# Patient Record
Sex: Female | Born: 1950 | Race: White | Hispanic: No | Marital: Single | Smoking: Former smoker
Health system: Southern US, Community
[De-identification: ages and names within clinical notes are randomized; demographics above are authoritative.]

## PROBLEM LIST (undated history)

## (undated) DIAGNOSIS — C801 Malignant (primary) neoplasm, unspecified: Secondary | ICD-10-CM

## (undated) DIAGNOSIS — J449 Chronic obstructive pulmonary disease, unspecified: Secondary | ICD-10-CM

---

## 2005-06-03 ENCOUNTER — Ambulatory Visit: Payer: Self-pay | Admitting: Family Medicine

## 2005-06-18 ENCOUNTER — Other Ambulatory Visit: Payer: Self-pay

## 2005-06-18 ENCOUNTER — Emergency Department: Payer: Self-pay | Admitting: Internal Medicine

## 2005-10-17 ENCOUNTER — Other Ambulatory Visit: Payer: Self-pay

## 2005-10-23 ENCOUNTER — Ambulatory Visit: Payer: Self-pay | Admitting: Urology

## 2011-10-22 ENCOUNTER — Inpatient Hospital Stay: Payer: Self-pay | Admitting: Urology

## 2011-10-22 LAB — CBC
HCT: 44.4 % (ref 35.0–47.0)
HGB: 14.5 g/dL (ref 12.0–16.0)
MCH: 30.1 pg (ref 26.0–34.0)
MCHC: 32.6 g/dL (ref 32.0–36.0)
MCV: 92 fL (ref 80–100)
RBC: 4.8 10*6/uL (ref 3.80–5.20)
WBC: 13.9 10*3/uL — ABNORMAL HIGH (ref 3.6–11.0)

## 2011-10-22 LAB — URINALYSIS, COMPLETE
Bilirubin,UR: NEGATIVE
Leukocyte Esterase: NEGATIVE
Ph: 9 (ref 4.5–8.0)
Protein: 30
RBC,UR: 38 /HPF (ref 0–5)
Squamous Epithelial: <1
WBC UR: 4 /HPF (ref 0–5)

## 2011-10-22 LAB — COMPREHENSIVE METABOLIC PANEL
Albumin: 3.9 g/dL (ref 3.4–5.0)
Alkaline Phosphatase: 113 U/L (ref 50–136)
Anion Gap: 10 (ref 7–16)
BUN: 12 mg/dL (ref 7–18)
Bilirubin,Total: 0.3 mg/dL (ref 0.2–1.0)
Calcium, Total: 9.1 mg/dL (ref 8.5–10.1)
Chloride: 107 mmol/L (ref 98–107)
Co2: 27 mmol/L (ref 21–32)
Creatinine: 0.8 mg/dL (ref 0.60–1.30)
EGFR (African American): >60
EGFR (Non-African Amer.): >60
Glucose: 140 mg/dL — ABNORMAL HIGH (ref 65–99)
Osmolality: 289 (ref 275–301)
Potassium: 4.3 mmol/L (ref 3.5–5.1)
SGOT(AST): 22 U/L (ref 15–37)
SGPT (ALT): 25 U/L
Sodium: 144 mmol/L (ref 136–145)
Total Protein: 8.1 g/dL (ref 6.4–8.2)

## 2011-10-22 LAB — PROTIME-INR
INR: 0.9
Prothrombin Time: 12.9 secs (ref 11.5–14.7)

## 2011-10-23 LAB — CBC WITH DIFFERENTIAL/PLATELET
Basophil #: 0 10*3/uL (ref 0.0–0.1)
Basophil %: 0.5 %
Eosinophil %: 0.1 %
HCT: 32.9 % — ABNORMAL LOW (ref 35.0–47.0)
Lymphocyte #: 3.1 10*3/uL (ref 1.0–3.6)
Lymphocyte %: 32.7 %
MCV: 92 fL (ref 80–100)
Monocyte %: 9.6 %
Neutrophil #: 5.3 10*3/uL (ref 1.4–6.5)
Platelet: 220 10*3/uL (ref 150–440)
RDW: 13.6 % (ref 11.5–14.5)
WBC: 9.4 10*3/uL (ref 3.6–11.0)

## 2011-10-23 LAB — BASIC METABOLIC PANEL
Anion Gap: 11 (ref 7–16)
BUN: 8 mg/dL (ref 7–18)
Chloride: 111 mmol/L — ABNORMAL HIGH (ref 98–107)
Co2: 23 mmol/L (ref 21–32)
Creatinine: 0.61 mg/dL (ref 0.60–1.30)
EGFR (African American): >60
EGFR (Non-African Amer.): >60
Potassium: 4 mmol/L (ref 3.5–5.1)
Sodium: 145 mmol/L (ref 136–145)

## 2011-10-24 LAB — URINE CULTURE

## 2011-10-28 LAB — CULTURE, BLOOD (SINGLE)

## 2011-10-29 ENCOUNTER — Ambulatory Visit: Payer: Self-pay | Admitting: Urology

## 2011-11-06 ENCOUNTER — Ambulatory Visit: Payer: Self-pay | Admitting: Urology

## 2011-11-12 ENCOUNTER — Ambulatory Visit: Payer: Self-pay | Admitting: Urology

## 2011-12-04 ENCOUNTER — Ambulatory Visit: Payer: Self-pay | Admitting: Urology

## 2012-05-02 ENCOUNTER — Emergency Department: Payer: Self-pay | Admitting: Emergency Medicine

## 2012-05-02 LAB — CBC
HCT: 36.6 % (ref 35.0–47.0)
HGB: 12.1 g/dL (ref 12.0–16.0)
MCH: 30.4 pg (ref 26.0–34.0)
MCHC: 33 g/dL (ref 32.0–36.0)
MCV: 92 fL (ref 80–100)
Platelet: 589 10*3/uL — ABNORMAL HIGH (ref 150–440)
RBC: 3.97 10*6/uL (ref 3.80–5.20)
RDW: 14 % (ref 11.5–14.5)
WBC: 11.3 10*3/uL — ABNORMAL HIGH (ref 3.6–11.0)

## 2012-05-02 LAB — COMPREHENSIVE METABOLIC PANEL
Alkaline Phosphatase: 92 U/L (ref 50–136)
Anion Gap: 11 (ref 7–16)
Calcium, Total: 9.1 mg/dL (ref 8.5–10.1)
Chloride: 106 mmol/L (ref 98–107)
Co2: 27 mmol/L (ref 21–32)
EGFR (African American): >60
Osmolality: 287 (ref 275–301)
Potassium: 3.3 mmol/L — ABNORMAL LOW (ref 3.5–5.1)
SGOT(AST): 14 U/L — ABNORMAL LOW (ref 15–37)
Sodium: 144 mmol/L (ref 136–145)

## 2012-05-02 LAB — TROPONIN I: Troponin-I: <0.02 ng/mL

## 2012-05-02 LAB — LIPASE, BLOOD: Lipase: 225 U/L (ref 73–393)

## 2012-05-03 LAB — URINALYSIS, COMPLETE
Bilirubin,UR: NEGATIVE
Nitrite: NEGATIVE
Ph: 6 (ref 4.5–8.0)
Protein: 30
RBC,UR: 31 /HPF (ref 0–5)

## 2013-09-30 IMAGING — CR DG CHEST 1V PORT
1 series · 1 of 1 positions shown · non-contrast
Comparison: none

REASON FOR EXAM: hypotension
COMMENTS:

PROCEDURE:     DXR - DXR PORTABLE CHEST SINGLE VIEW  - October 22, 2011 [DATE]
RESULT:     Comparison: None

[ap]
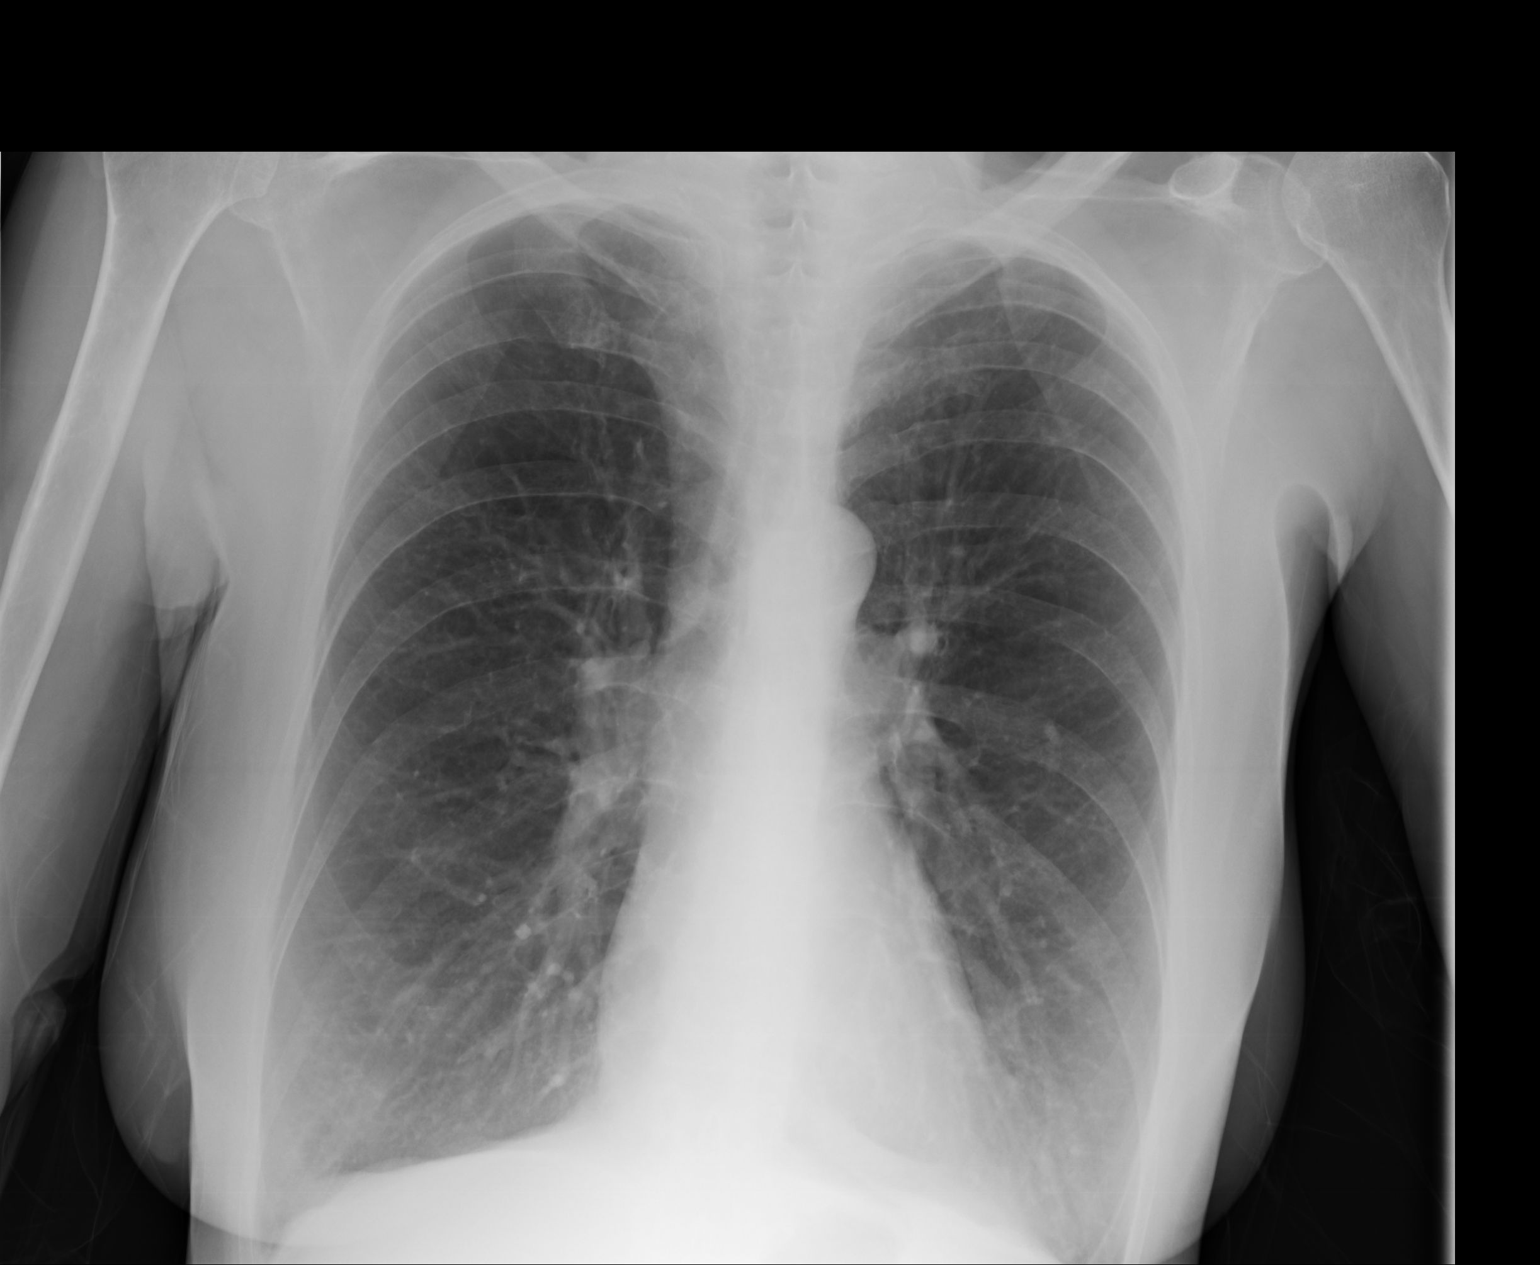

[1 of 1 positions shown; findings below may reference images not displayed]

FINDINGS: Single portable AP chest radiograph is provided. The left costophrenic angle
is excluded from the field-of-view. The lungs are hyperinflated likely
secondary to COPD. There is no focal parenchymal opacity, pleural effusion,
or pneumothorax. Normal cardiomediastinal silhouette. The osseous structures
are unremarkable.
IMPRESSION: No acute disease of the che[REDACTED]

## 2013-09-30 IMAGING — CR DG ABDOMEN 1V
1 series · 1 of 1 positions shown · non-contrast
Comparison: none

REASON FOR EXAM: visualize stone for possible ESL
COMMENTS:

PROCEDURE:     DXR - DXR KIDNEY URETER BLADDER  - October 22, 2011  [DATE]
RESULT:     Comparisons:  None

[x abdomen supine]
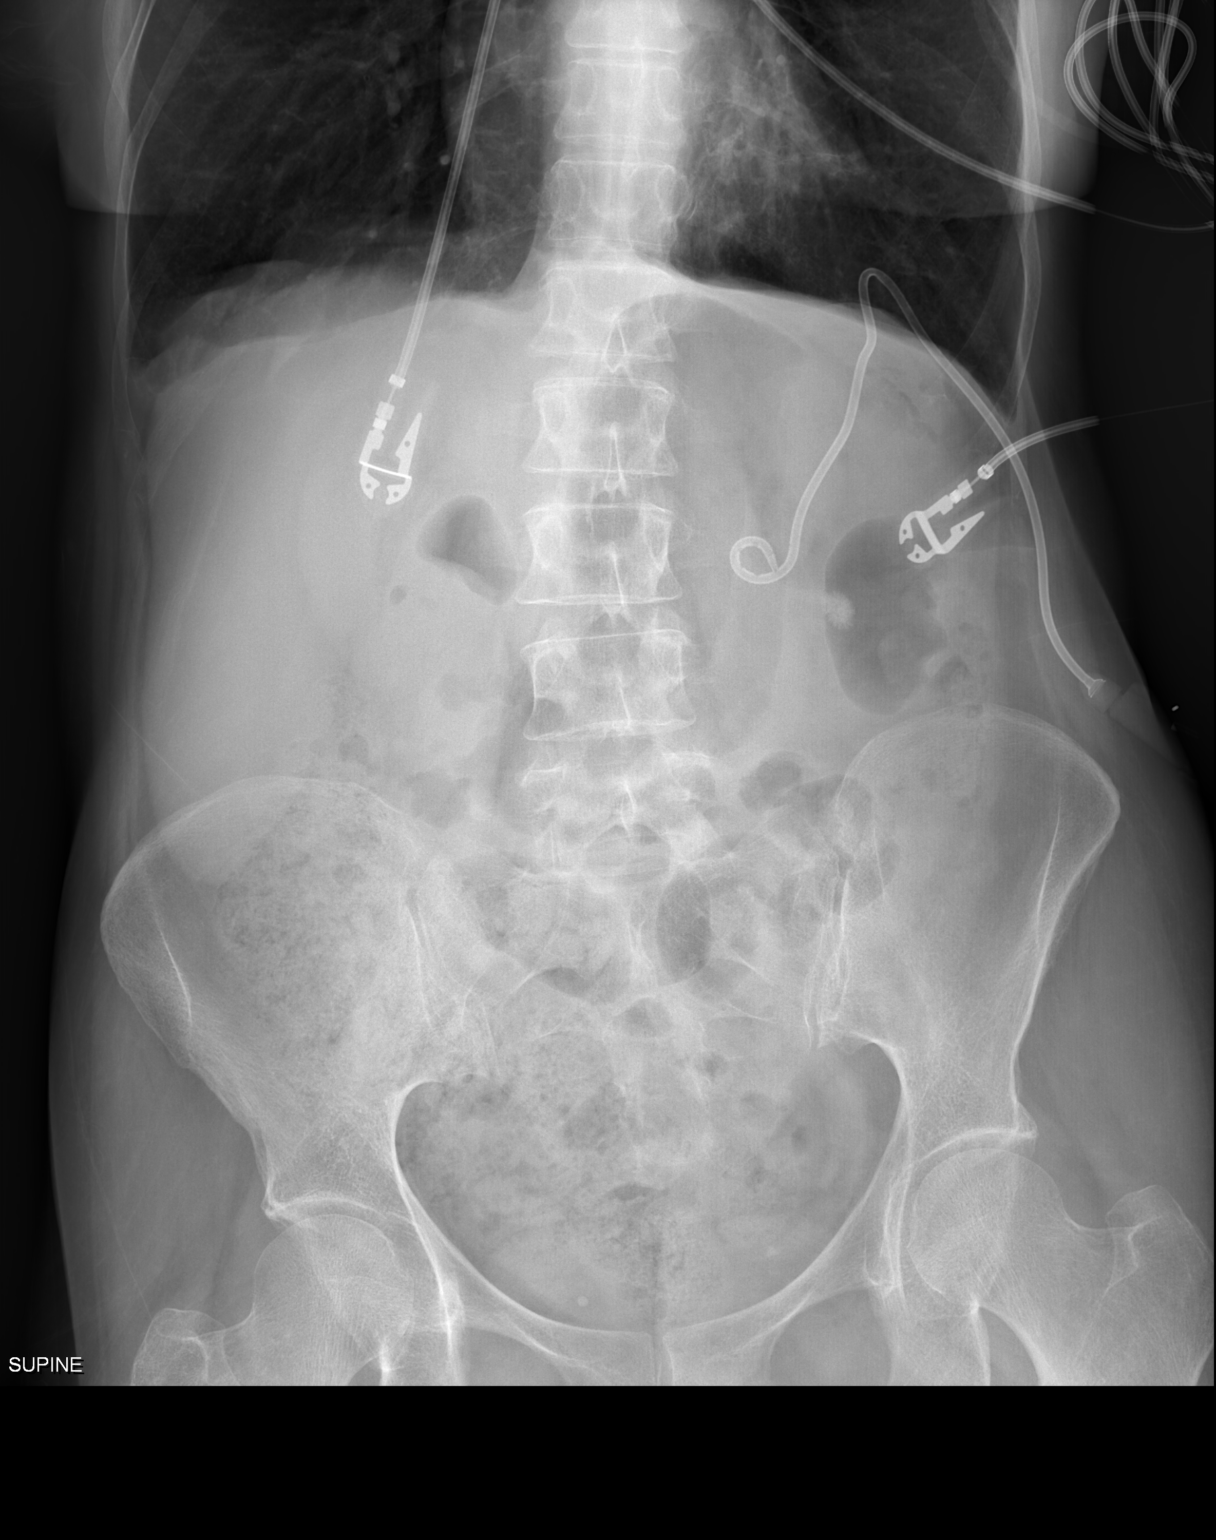

[1 of 1 positions shown; findings below may reference images not displayed]

FINDINGS: Supine radiograph of the abdomen is provided.

The visualized lungs are hyperinflated likely secondary to COPD.

There is a nonspecific bowel gas pattern. There is no bowel dilatation to
suggest obstruction. There is a left percutaneous nephrostomy tube present.
There is a spiculated calcification projecting over the left kidney. There
is no pathologic calcification along the expected course of the ureters.
There is no evidence of pneumoperitoneum, portal venous gas, or pneumatosis.

The osseous structures are unremarkable.
IMPRESSION: Left nephrolithiasis.

## 2013-10-21 IMAGING — XA IR NEPHROSTOMY
9 series · 9 of 9 positions shown · non-contrast
Comparison: none

REASON FOR EXAM: LT Nephrostogram Tube Removal   hydronephrosis
COMMENTS:

[Series 1: single · 1 of 1 slices shown (1 of 9)]
[im 1/1]
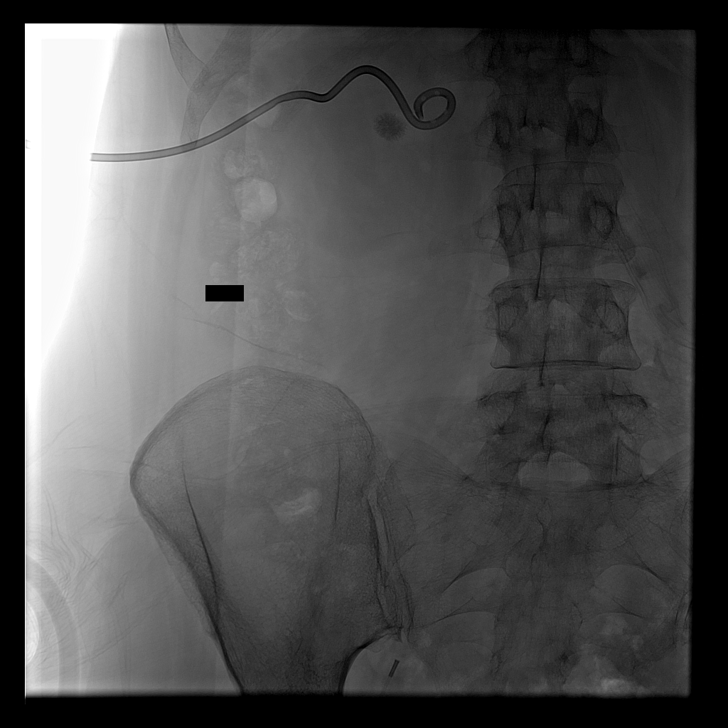

[Series 2: single · 1 of 1 slices shown (2 of 9)]
[im 1/1]
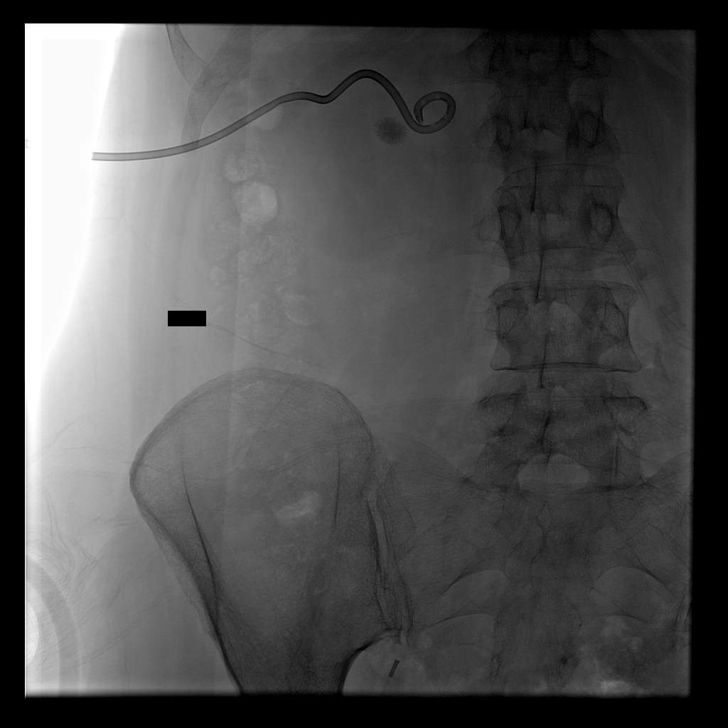

[Series 3: single · 1 of 1 slices shown (3 of 9)]
[im 1/1]
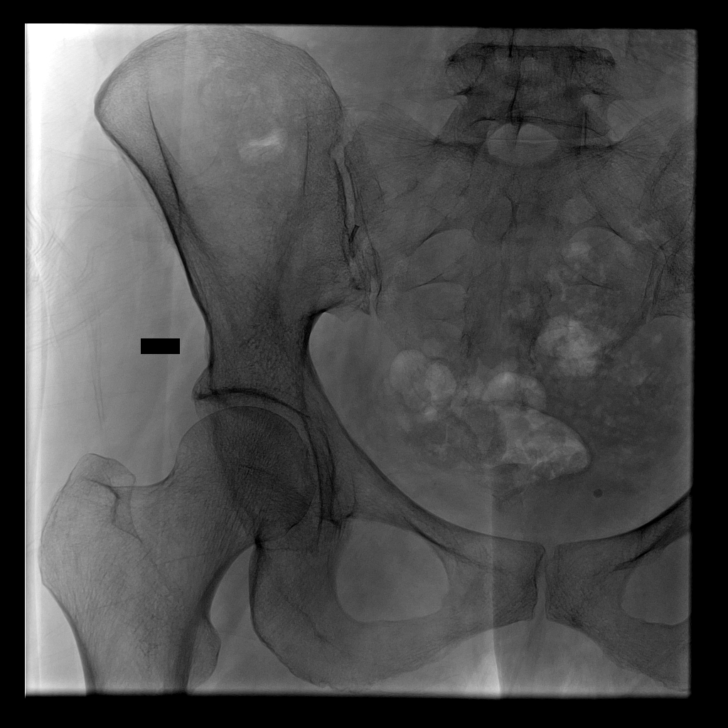

[Series 4: single · 1 of 1 slices shown (4 of 9)]
[im 1/1]
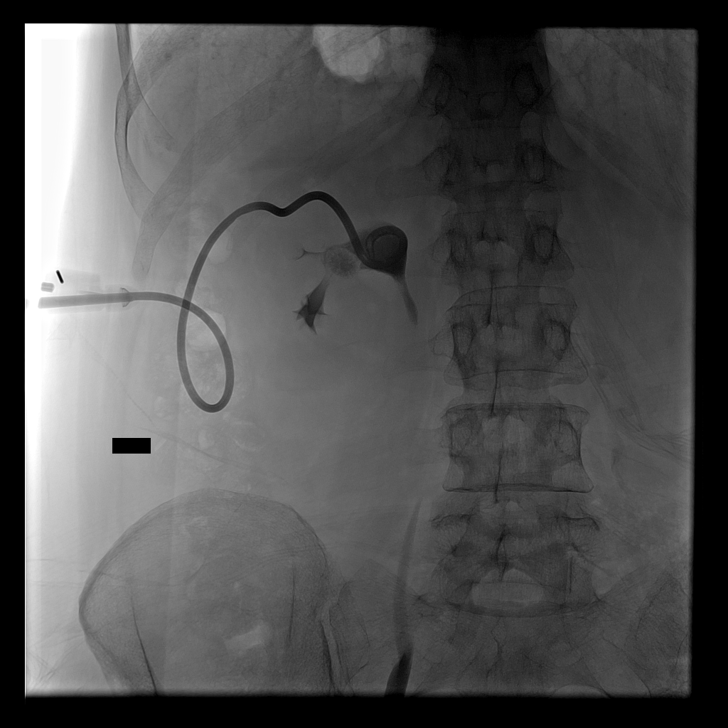

[Series 5: single · 1 of 1 slices shown (5 of 9)]
[im 1/1]
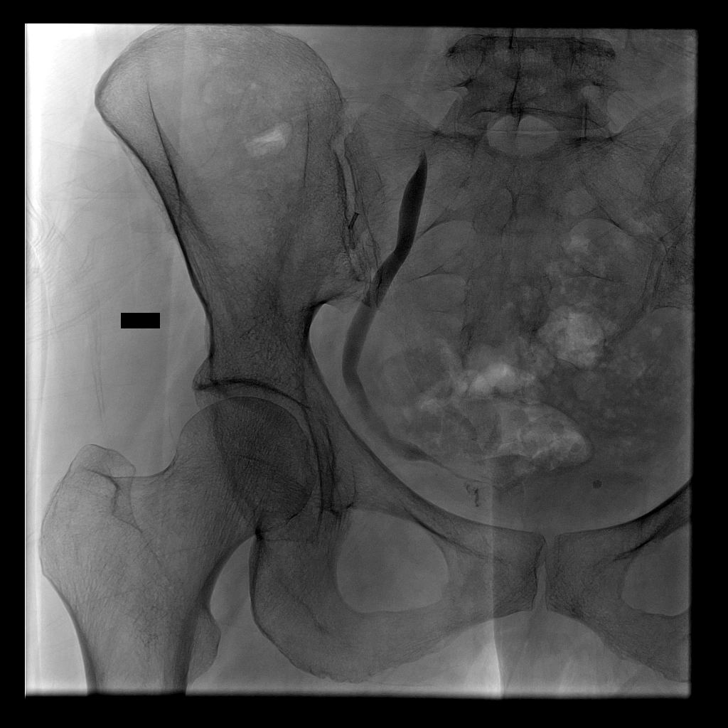

[Series 6: single · 1 of 1 slices shown (6 of 9)]
[im 1/1]
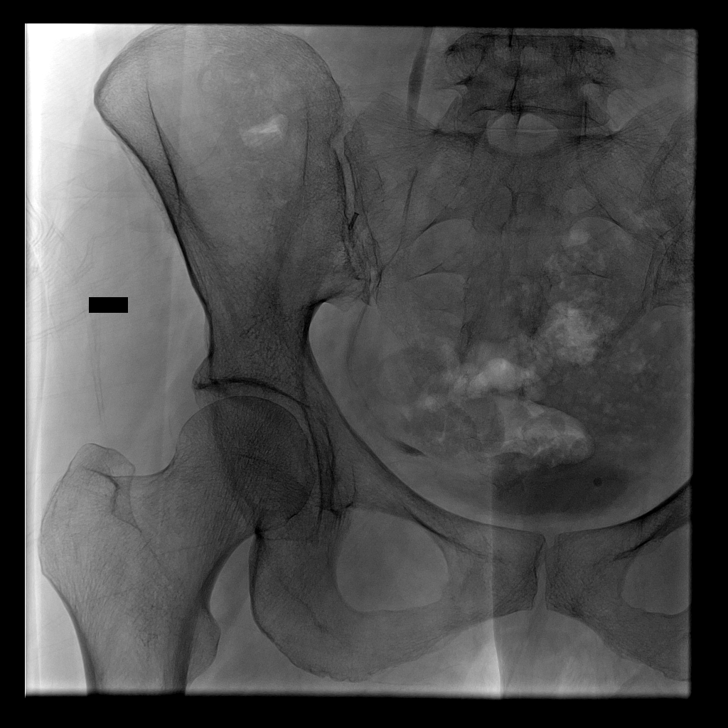

[Series 7: single · 1 of 1 slices shown (7 of 9)]
[im 1/1]
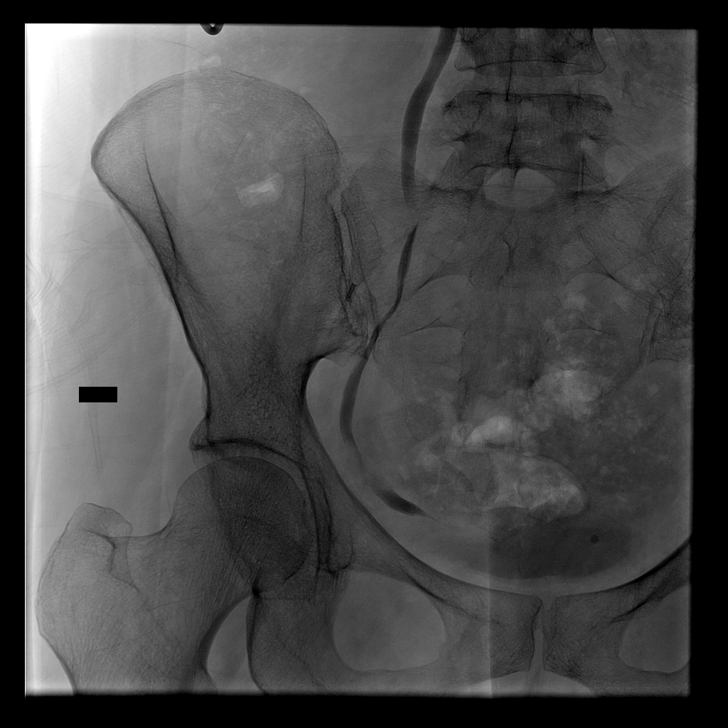

[Series 10: single · 1 of 1 slices shown (8 of 9)]
[im 1/1]
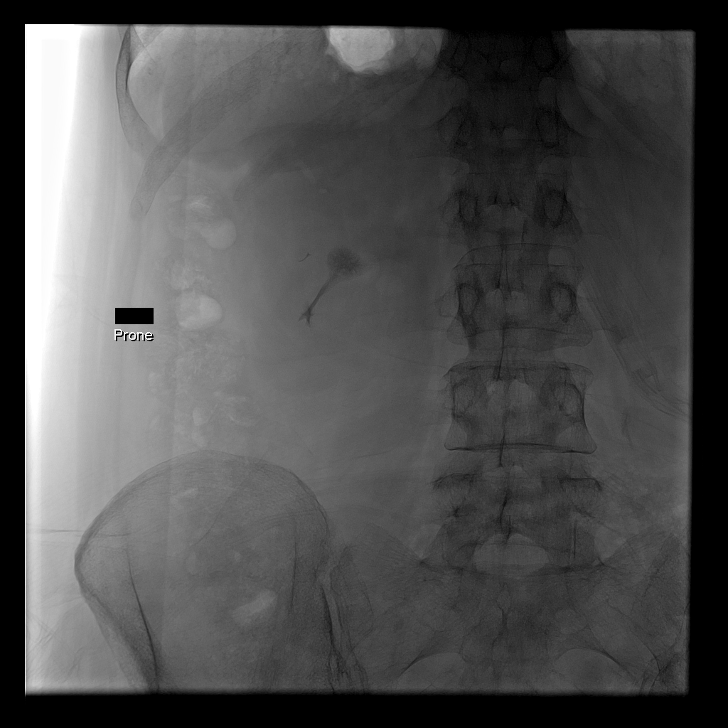

[Series 12: single · 1 of 1 slices shown (9 of 9)]
[im 1/1]
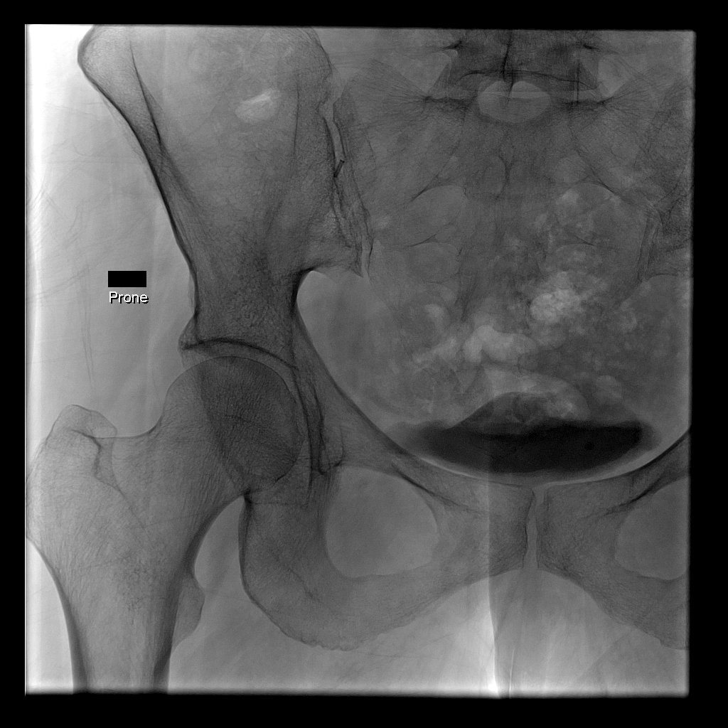

[9 of 9 positions shown; findings below may reference images not displayed]

PROCEDURE:     VAS - NEPHROSTOGRAM  - November 12, 2011  [DATE]

RESULT:

A left-sided nephrostogram was performed status post intravenous
administration of 50% diluted solution of sterile radiopaque contrast. The
patient's nephrostomy tube was injected. Contrast is appreciated filling a
decompressed left urinary collecting system with drainage appreciated within
the ureter and into the urinary bladder. Appropriate confirmatory imaging
was obtained. The nephrostomy tube was cut and then removed over an Amplatz
guidewire utilizing fluoroscopic guidance. The patient tolerated the
procedure without complications.
IMPRESSION: Left-sided nephrostomy tube removal as described above.

## 2014-11-26 NOTE — H&P (Signed)
PATIENT NAME:  Stacy, Zhang MR#:  [id number] DATE OF BIRTH:  02-09-1951  DATE OF ADMISSION:  10/22/2011  PRIMARY CARE PHYSICIAN: Salome Holmes, MD  CHIEF COMPLAINT: Left flank pain today with nausea and vomiting.  HISTORY OF PRESENT ILLNESS: Stacy Zhang is a pleasant 64 year old Caucasian female with past medical history of chronic obstructive pulmonary disease/emphysema and history of kidney stones who comes to the Emergency Room accompanied by her husband with complaints of left flank pain which woke her up in the morning around 3:30 a.m. and started having some vomiting along with nausea. The patient has been having hot and cold sweats and came to the Emergency Room via EMS. The patient was initially hypotensive and tachycardic on arrival and her blood pressure improved after 2 liters of IV fluids. She was found to have left distal ureterolithiasis along with left medullary kidney stone with obstructive uropathy. She received a dose of IV Rocephin.   The ER physician spoke with Dr. Maryan Puls and Dr. Yves Dill recommended to get left nephrostomy tube placement, IV antibiotics, and IV fluids. Internal Medicine was contacted for the patient's admission given her hypotension, possible sepsis/systemic inflammatory response syndrome due to infected urolithiasis and hydronephrosis. The patient is being admitted for further evaluation and management.   PAST MEDICAL HISTORY: Chronic obstructive pulmonary disease.   MEDICATIONS:  1. Q-Var 80 mcg/inhalation twice a day. 2. Combivent inhaler one puff four times daily.   PAST MEDICAL HISTORY:  1. Emphysema/chronic obstructive pulmonary disease.  2. History of nephrolithiasis in the past requiring stent placement and lithotripsy, performed in 1999.  ALLERGIES: No known drug allergies.   SOCIAL HISTORY: Ex-smoker. No alcohol use. Lives with husband at home.   FAMILY HISTORY: Positive for heart disease.   REVIEW OF SYSTEMS: CONSTITUTIONAL: Positive for  fever, fatigue, and weakness. EYES: No blurred or double vision. ENT: No tinnitus, ear pain, or hearing loss. RESPIRATORY: No cough, wheeze, or hemoptysis. CARDIOVASCULAR: No chest pain, orthopnea, or edema. GASTROINTESTINAL: Positive for nausea, vomiting, and abdominal pain. GU: Positive for dysuria. ENDOCRINE: No nocturia, polyuria, or thyroid problems. HEMATOLOGY: No anemia or easy bruising. SKIN: No acne or rash. MUSCULOSKELETAL: Positive for arthritis. NEUROLOGIC: No cerebrovascular accident or transient ischemic attack. PSYCH: No anxiety or depression. All other systems reviewed and negative.   PHYSICAL EXAMINATION:   GENERAL: The patient is awake, alert, and oriented x3, not in acute distress.   VITAL SIGNS: Temperature 99.4, pulse 121, blood pressure 113/61, and saturations are 99% on room air.   HEENT: Atraumatic, normocephalic. Pupils are equal, round, and reactive to light and accommodation. Extraocular movements intact. Oral mucosa is dry.   NECK: Supple. No JVD. No carotid bruit.   LUNGS: Clear to auscultation bilaterally. No rales, rhonchi, respiratory distress, or labored breathing.   HEART: Both heart sounds are normal. Rhythm is normal. Rate is tachycardic. No murmur heard. PMI is not lateralized. Chest is nontender.   EXTREMITIES: Good pedal pulses. Good femoral pulses. No lower extremity edema.   ABDOMEN: Soft. There is tenderness present in the left flank. No organomegaly. Bowel sounds are hypoactive.   NEUROLOGIC: Grossly intact cranial nerves II through XII. No motor or sensory deficits.   PSYCH: The patient is awake, alert, and oriented x3.   Chest x-ray: No acute disease of the chest.   Urinalysis is positive for urinary tract infection.  CT of the abdomen shows distal left ureteral calculus as well as left medullary calculus with moderate obstructive uropathy. There is stranding and findings  which may represent edema within the kidney.   White count 13.9,  hemoglobin and hematocrit 14.5 and 44.4. Lipase is normal. Comprehensive metabolic panel within normal limits, except glucose of 140. PT and INR 12.9 and 1.9.   ASSESSMENT: 64 year old Stacy Zhang with: 1. SIRS due to left-sided nephrolithiasis, ureterolithiasis, and urinary tract infection.  2. Left hydronephrosis due to left-sided ureterolithiasis and nephrolithiasis.  3. Hypertension now resolved with IV fluids.  4. Chronic obstructive pulmonary disease/emphysema, stable.  5. Leukocytosis.  6. History of kidney stones in the past.   PLAN:  1. Admit the patient to off unit telemetry.  2. FULL CODE.  3. Continue IV fluids.  4. Follow blood cultures, urine cultures.  5. Continue IV Rocephin.  6. Urology consultation by Dr. Maryan Puls. The case was discussed with Dr. Yves Dill who will see the patient in consultation. Dr. Eliberto Ivory recommends nephrostomy tube placement, which is going to be done by interventional radiology today.  7. Heparin for deep vein thrombosis prophylaxis.  8. PRN morphine for pain.  9. We will continue albuterol inhalers.         10. Further work-up according to the patient's clinical course. The hospital admission plan was discussed with the patient and her husband who were agreeable to it.   TIME SPENT: 50 minutes. ____________________________ Hart Rochester Posey Pronto, MD sap:slb D: 10/22/2011 13:21:14 ET T: 10/22/2011 14:36:33 ET JOB#: [id number]  cc: Keshia Weare A. Posey Pronto, MD, <Dictator> Salome Holmes, MD Ilda Basset MD ELECTRONICALLY SIGNED 11/10/2011 6:32

## 2014-11-26 NOTE — Consult Note (Signed)
PATIENT NAME:  Stacy Zhang, Stacy Zhang MR#:  [id number] DATE OF BIRTH:  [birth year]  DATE OF CONSULTATION:  10/22/2011  REFERRING PHYSICIAN:  Fritzi Mandes, MD CONSULTING PHYSICIAN:  Otelia Limes. Yves Dill, MD  REASON FOR CONSULTATION: Kidney stone.   HISTORY OF PRESENT ILLNESS: Stacy Zhang is a 64 year old Caucasian female with a one-month history of intermittent left flank pain. The pain became severe this morning and was associated with nausea and vomiting prompting Emergency Room visit. She had a CT scan in the Emergency Room which indicated the presence of a distal left ureteral stone with hydronephrosis and an edematous left kidney with stranding of the perinephric fat. The patient was hypotensive in the Emergency Room and felt to be toxic versus dehydrated. She was fluid resuscitated and given antibiotics. I ordered a nephrostomy tube placed which she currently has in place. At the present time, she is feeling somewhat better, although still having some flank pain.   ALLERGIES: No drug allergies.   CURRENT MEDICATIONS: Combivent and Qvar.   PAST SURGICAL HISTORY: Lithotripsy in 1999 and 2007.   SOCIAL HISTORY: She quit smoking 10 years ago with a 50 pack-year history. She denied alcohol or recreational drug use.   FAMILY HISTORY: Noncontributory.   PAST AND CURRENT MEDICAL CONDITIONS: The patient has a history of chronic obstructive pulmonary disease. She denied chest pain, heart disease, diabetes, stroke, or hypertension.   PHYSICAL EXAMINATION:  GENERAL: She is a chronically ill-appearing white female in no distress.   HEENT: Sclerae were clear. Pupils are equal, round, and reactive to light. Extraocular movements intact.   NECK: Supple. No palpable cervical adenopathy.   LUNGS: Decreased breath sounds bilaterally but clear.   CARDIOVASCULAR: Regular rhythm and rate without audible murmurs or gallops.   ABDOMEN: Soft, nontender abdomen. Nephrostomy tube was in place.   GU/RECTAL: Deferred.    MUSCULOSKELETAL/NEUROLOGIC: Nonfocal.  IMPRESSION: 1. Left pyonephrosis. 2. 4.7 mm distal left ureteral stone with obstruction.   PLAN:  1. Continue nephrostomy tube drainage.  2. Convert the patient to oral analgesics and advance diet.  3. Continue antibiotics pending culture results.  4. Ultimately, the patient will need lithotripsy after her urinary tract infection has resolved.   ____________________________ Otelia Limes. Yves Dill, MD mrw:ap D: 10/22/2011 17:55:38 ET T: 10/23/2011 07:28:47 ET JOB#: [id number]  cc: Otelia Limes. Yves Dill, MD, <Dictator>  Royston Cowper MD ELECTRONICALLY SIGNED 10/23/2011 10:28

## 2016-05-15 ENCOUNTER — Encounter: Payer: Self-pay | Admitting: Emergency Medicine

## 2016-05-15 ENCOUNTER — Emergency Department
Admission: EM | Admit: 2016-05-15 | Discharge: 2016-05-15 | Disposition: A | Discharge status: Home / Self Care | Payer: Medicare Other | Attending: Emergency Medicine | Admitting: Emergency Medicine

## 2016-05-15 DIAGNOSIS — Z87891 Personal history of nicotine dependence: Secondary | ICD-10-CM | POA: Insufficient documentation

## 2016-05-15 DIAGNOSIS — J449 Chronic obstructive pulmonary disease, unspecified: Secondary | ICD-10-CM | POA: Diagnosis not present

## 2016-05-15 DIAGNOSIS — B029 Zoster without complications: Secondary | ICD-10-CM | POA: Diagnosis not present

## 2016-05-15 DIAGNOSIS — R21 Rash and other nonspecific skin eruption: Secondary | ICD-10-CM | POA: Diagnosis present

## 2016-05-15 HISTORY — DX: Chronic obstructive pulmonary disease, unspecified: J44.9

## 2016-05-15 MED ORDER — SULFAMETHOXAZOLE-TRIMETHOPRIM 800-160 MG PO TABS: 1.0000 | ORAL_TABLET | Freq: Two times a day (BID) | ORAL | 0 refills | Status: DC

## 2016-05-15 MED ORDER — LIDOCAINE 5 % EX PTCH: 1.0000 | MEDICATED_PATCH | Freq: Two times a day (BID) | CUTANEOUS | 0 refills | Status: AC

## 2016-05-15 MED ORDER — VALACYCLOVIR HCL 1 G PO TABS: 1000.0000 mg | ORAL_TABLET | Freq: Three times a day (TID) | ORAL | 0 refills | Status: AC

## 2016-05-15 MED ORDER — OXYCODONE-ACETAMINOPHEN 5-325 MG PO TABS
1.0000 | ORAL_TABLET | Freq: Once | ORAL | Status: AC
  Administered 2016-05-15: 1 via ORAL
  Filled 2016-05-15: qty 1

## 2016-05-15 MED ORDER — OXYCODONE-ACETAMINOPHEN 5-325 MG PO TABS: 1.0000 | ORAL_TABLET | Freq: Four times a day (QID) | ORAL | 0 refills | Status: AC | PRN

## 2016-05-15 NOTE — ED Triage Notes (Signed)
C/O left mid back  Radiating to right mid back and side.  Pain accompanied by rash.

## 2016-05-15 NOTE — ED Provider Notes (Signed)
[facility] Emergency Department Provider Note  ____________________________________________  Time seen: Approximately 4:34 PM  I have reviewed the triage vital signs and the nursing notes.   HISTORY  Chief Complaint Rash    HPI Stacy Zhang is a 65 y.o. female , NAD, presents to the emergency department accompanied by her husband who assists with history. States she had a pain in her left mid back that began Saturday. Initially thought is was related to her COPD but she had no cough, chest congestion, chest pain, wheezing nor shortness of breath. Noted a rash show up with blisters the next day. The rash has spread and now wraps around the left-sided body onto the abdomen. Notes the pain about the rash and her left side is severe and tingles. Has not noted any oozing or weeping. No redness or swelling.No injuries or traumas to the torso. Denies fevers or chills. No pain about the eyes. No exposures to anyone with similar symptoms.   Past Medical History:  Diagnosis Date  . COPD (chronic obstructive pulmonary disease) (HCC)     There are no active problems to display for this patient.   History reviewed. No pertinent surgical history.  Prior to Admission medications   Medication Sig Start Date End Date Taking? Authorizing Provider  lidocaine (LIDODERM) 5 % Place 1 patch onto the skin every 12 (twelve) hours. Remove & Discard patch within 12 hours or as directed by MD 05/15/16 05/15/17  Jami L Hagler, PA-C  oxyCODONE-acetaminophen (ROXICET) 5-325 MG tablet Take 1 tablet by mouth every 6 (six) hours as needed. 05/15/16 05/15/17  Jami L Hagler, PA-C  sulfamethoxazole-trimethoprim (BACTRIM DS,SEPTRA DS) 800-160 MG tablet Take 1 tablet by mouth 2 (two) times daily. 05/15/16   Jami L Hagler, PA-C  valACYclovir (VALTREX) 1000 MG tablet Take 1 tablet (1,000 mg total) by mouth 3 (three) times daily. 05/15/16   Jami L Hagler, PA-C    Allergies Review of patient's  allergies indicates no known allergies.  No family history on file.  Social History Social History  Substance Use Topics  . Smoking status: Former Research scientist (life sciences)  . Smokeless tobacco: Never Used  . Alcohol use No     Review of Systems  Constitutional: No fever/chills Eyes: No eye pain Cardiovascular: No chest pain. Respiratory: No cough. No shortness of breath. No wheezing.  Gastrointestinal: No abdominal pain.  No nausea, vomiting.   Musculoskeletal: Negative for general myalgias.  Skin: Positive for rash. No oozing, weeping, redness, swelling.  Neurological: Positive tingling about rash. Negative for  focal weakness or numbness. 10-point ROS otherwise negative.  ____________________________________________   PHYSICAL EXAM:  VITAL SIGNS: ED Triage Vitals  Enc Vitals Group     BP 05/15/16 1612 132/88     Pulse Rate 05/15/16 1612 (!) 106     Resp 05/15/16 1612 16     Temp 05/15/16 1612 98.1 F (36.7 C)     Temp src --      SpO2 05/15/16 1612 94 %     Weight 05/15/16 1615 100 lb (45.4 kg)     Height 05/15/16 1615 5\' 2"  (1.575 m)     Head Circumference --      Peak Flow --      Pain Score 05/15/16 1615 10     Pain Loc --      Pain Edu? --      Excl. in [place]? --      Constitutional: Alert and oriented. Well appearing and in no  acute distress. Eyes: Conjunctivae are normal without icterus or injection  Head: Atraumatic.  Neck: Supple with FROM Hematological/Lymphatic/Immunilogical: No cervical lymphadenopathy. Cardiovascular: Normal rate, regular rhythm. Normal S1 and S2.  Good peripheral circulation. Respiratory: Normal respiratory effort without tachypnea or retractions. Lungs CTAB with breath sounds noted in all lung fields. Musculoskeletal: FROM of bilateral upper and lower extremities without pain or difficulty Neurologic:  Normal speech and language. No gross focal neurologic deficits are appreciated.  Skin:  Vesicular rash noted in a dermatomal pattern about the  torso from the left mid back wrapping around to the left mid and lower abdomen that does not cross the central line. No active oozing or weeping. Area is tender to touch. Rash's plans approximately 7 cm with and 15 cm in length. Skin is warm, dry and intact.  Psychiatric: Mood and affect are normal. Speech and behavior are normal. Patient exhibits appropriate insight and judgement.   ____________________________________________   LABS  None ____________________________________________  EKG  None ____________________________________________  RADIOLOGY  None ____________________________________________    PROCEDURES  Procedure(s) performed: None   Procedures   Medications  oxyCODONE-acetaminophen (PERCOCET/ROXICET) 5-325 MG per tablet 1 tablet (1 tablet Oral Given 05/15/16 1648)     ____________________________________________   INITIAL IMPRESSION / ASSESSMENT AND PLAN / ED COURSE  Pertinent labs & imaging results that were available during my care of the patient were reviewed by me and considered in my medical decision making (see chart for details).  Clinical Course    Patient's diagnosis is consistent with Herpes zoster without complication. Patient will be discharged home with prescriptions for Lidoderm patches, Percocet, Bactrim DS and Valtrex to take as directed. Patient is to follow up with her primary care provider in 48 hours for recheck and potential refill of pain medications. Discussion had with the patient and her husband at the bedside that considering the rash onset was 4 days ago that the antivirals may or may not assist and full resolution of the rash in a timely manner. Discussed the possibility of postherpetic neuralgia and complications of such. Patient and her husband were highly advised to call her primary care provider today to ensure she has a follow-up within 48 hours. Patient is given ED precautions to return to the ED for any worsening or new  symptoms.   ____________________________________________  FINAL CLINICAL IMPRESSION(S) / ED DIAGNOSES  Final diagnoses:  Herpes zoster without complication      NEW MEDICATIONS STARTED DURING THIS VISIT:  New Prescriptions   LIDOCAINE (LIDODERM) 5 %    Place 1 patch onto the skin every 12 (twelve) hours. Remove & Discard patch within 12 hours or as directed by MD   OXYCODONE-ACETAMINOPHEN (ROXICET) 5-325 MG TABLET    Take 1 tablet by mouth every 6 (six) hours as needed.   SULFAMETHOXAZOLE-TRIMETHOPRIM (BACTRIM DS,SEPTRA DS) 800-160 MG TABLET    Take 1 tablet by mouth 2 (two) times daily.   VALACYCLOVIR (VALTREX) 1000 MG TABLET    Take 1 tablet (1,000 mg total) by mouth 3 (three) times daily.         Braxton Feathers, PA-C 05/15/16 [place] Quigley, MD 05/15/16 [phone]

## 2017-09-04 ENCOUNTER — Other Ambulatory Visit: Payer: Self-pay

## 2017-09-04 ENCOUNTER — Emergency Department: Payer: Medicare Other

## 2017-09-04 ENCOUNTER — Inpatient Hospital Stay
Admission: EM | Admit: 2017-09-04 | Discharge: 2017-09-15 | DRG: 871 | Disposition: A | Discharge status: Home / Self Care | Payer: Medicare Other | Attending: Family Medicine | Admitting: Family Medicine

## 2017-09-04 DIAGNOSIS — Z66 Do not resuscitate: Secondary | ICD-10-CM | POA: Diagnosis present

## 2017-09-04 DIAGNOSIS — E876 Hypokalemia: Secondary | ICD-10-CM | POA: Diagnosis not present

## 2017-09-04 DIAGNOSIS — Z79899 Other long term (current) drug therapy: Secondary | ICD-10-CM

## 2017-09-04 DIAGNOSIS — N1330 Unspecified hydronephrosis: Secondary | ICD-10-CM | POA: Diagnosis present

## 2017-09-04 DIAGNOSIS — R339 Retention of urine, unspecified: Secondary | ICD-10-CM

## 2017-09-04 DIAGNOSIS — Z681 Body mass index (BMI) 19 or less, adult: Secondary | ICD-10-CM

## 2017-09-04 DIAGNOSIS — C787 Secondary malignant neoplasm of liver and intrahepatic bile duct: Secondary | ICD-10-CM | POA: Diagnosis present

## 2017-09-04 DIAGNOSIS — E43 Unspecified severe protein-calorie malnutrition: Secondary | ICD-10-CM | POA: Diagnosis present

## 2017-09-04 DIAGNOSIS — Z515 Encounter for palliative care: Secondary | ICD-10-CM | POA: Diagnosis present

## 2017-09-04 DIAGNOSIS — J441 Chronic obstructive pulmonary disease with (acute) exacerbation: Secondary | ICD-10-CM | POA: Diagnosis present

## 2017-09-04 DIAGNOSIS — R000 Tachycardia, unspecified: Secondary | ICD-10-CM | POA: Diagnosis present

## 2017-09-04 DIAGNOSIS — J9621 Acute and chronic respiratory failure with hypoxia: Secondary | ICD-10-CM | POA: Diagnosis present

## 2017-09-04 DIAGNOSIS — N179 Acute kidney failure, unspecified: Secondary | ICD-10-CM | POA: Diagnosis present

## 2017-09-04 DIAGNOSIS — Z87891 Personal history of nicotine dependence: Secondary | ICD-10-CM

## 2017-09-04 DIAGNOSIS — J9601 Acute respiratory failure with hypoxia: Secondary | ICD-10-CM

## 2017-09-04 DIAGNOSIS — R310 Gross hematuria: Secondary | ICD-10-CM

## 2017-09-04 DIAGNOSIS — M25551 Pain in right hip: Secondary | ICD-10-CM | POA: Diagnosis not present

## 2017-09-04 DIAGNOSIS — R6520 Severe sepsis without septic shock: Secondary | ICD-10-CM | POA: Diagnosis present

## 2017-09-04 DIAGNOSIS — F419 Anxiety disorder, unspecified: Secondary | ICD-10-CM | POA: Diagnosis present

## 2017-09-04 DIAGNOSIS — I214 Non-ST elevation (NSTEMI) myocardial infarction: Secondary | ICD-10-CM | POA: Diagnosis present

## 2017-09-04 DIAGNOSIS — J189 Pneumonia, unspecified organism: Secondary | ICD-10-CM | POA: Diagnosis present

## 2017-09-04 DIAGNOSIS — R64 Cachexia: Secondary | ICD-10-CM | POA: Diagnosis present

## 2017-09-04 DIAGNOSIS — A419 Sepsis, unspecified organism: Principal | ICD-10-CM | POA: Diagnosis present

## 2017-09-04 DIAGNOSIS — R54 Age-related physical debility: Secondary | ICD-10-CM | POA: Diagnosis present

## 2017-09-04 DIAGNOSIS — C7951 Secondary malignant neoplasm of bone: Secondary | ICD-10-CM | POA: Diagnosis present

## 2017-09-04 DIAGNOSIS — C539 Malignant neoplasm of cervix uteri, unspecified: Secondary | ICD-10-CM | POA: Diagnosis present

## 2017-09-04 LAB — COMPREHENSIVE METABOLIC PANEL
ALK PHOS: 106 U/L (ref 38–126)
ALT: 24 U/L (ref 14–54)
AST: 67 U/L — AB (ref 15–41)
Albumin: 3 g/dL — ABNORMAL LOW (ref 3.5–5.0)
Anion gap: 12 (ref 5–15)
BUN: 16 mg/dL (ref 6–20)
CALCIUM: 8.8 mg/dL — AB (ref 8.9–10.3)
CHLORIDE: 102 mmol/L (ref 101–111)
CO2: 23 mmol/L (ref 22–32)
Creatinine, Ser: 1.08 mg/dL — ABNORMAL HIGH (ref 0.44–1.00)
GFR calc Af Amer: >60 mL/min (ref >60)
GFR calc non Af Amer: 52 mL/min — ABNORMAL LOW (ref >60)
Glucose, Bld: 110 mg/dL — ABNORMAL HIGH (ref 65–99)
Potassium: 4 mmol/L (ref 3.5–5.1)
SODIUM: 137 mmol/L (ref 135–145)
Total Bilirubin: 0.7 mg/dL (ref 0.3–1.2)
Total Protein: 7.8 g/dL (ref 6.5–8.1)

## 2017-09-04 LAB — CBC WITH DIFFERENTIAL/PLATELET
BASOS ABS: 0.1 10*3/uL (ref 0–0.1)
Basophils Relative: 0 %
EOS PCT: 0 %
Eosinophils Absolute: 0 10*3/uL (ref 0–0.7)
HCT: 36.5 % (ref 35.0–47.0)
HEMOGLOBIN: 11.8 g/dL — AB (ref 12.0–16.0)
LYMPHS ABS: 1.4 10*3/uL (ref 1.0–3.6)
LYMPHS PCT: 4 %
MCH: 27.7 pg (ref 26.0–34.0)
MCHC: 32.2 g/dL (ref 32.0–36.0)
MCV: 85.9 fL (ref 80.0–100.0)
Monocytes Absolute: 1.9 10*3/uL — ABNORMAL HIGH (ref 0.2–0.9)
Monocytes Relative: 5 %
NEUTROS PCT: 91 %
Neutro Abs: 34.1 10*3/uL — ABNORMAL HIGH (ref 1.4–6.5)
PLATELETS: 432 10*3/uL (ref 150–440)
RBC: 4.25 MIL/uL (ref 3.80–5.20)
RDW: 14.5 % (ref 11.5–14.5)
WBC: 37.5 10*3/uL — AB (ref 3.6–11.0)

## 2017-09-04 LAB — TROPONIN I: Troponin I: 0.03 ng/mL (ref <0.03)

## 2017-09-04 LAB — INFLUENZA PANEL BY PCR (TYPE A & B)
INFLAPCR: NEGATIVE
Influenza B By PCR: NEGATIVE

## 2017-09-04 MED ORDER — IPRATROPIUM-ALBUTEROL 0.5-2.5 (3) MG/3ML IN SOLN
RESPIRATORY_TRACT | Status: AC
  Administered 2017-09-04: 3 mL via RESPIRATORY_TRACT
  Filled 2017-09-04: qty 6

## 2017-09-04 MED ORDER — IPRATROPIUM-ALBUTEROL 0.5-2.5 (3) MG/3ML IN SOLN
3.0000 mL | Freq: Once | RESPIRATORY_TRACT | Status: AC
  Administered 2017-09-04: 3 mL via RESPIRATORY_TRACT

## 2017-09-04 MED ORDER — DEXTROSE 5 % IV SOLN
500.0000 mg | Freq: Once | INTRAVENOUS | Status: AC
  Administered 2017-09-05: 500 mg via INTRAVENOUS
  Filled 2017-09-04: qty 500

## 2017-09-04 MED ORDER — CEFTRIAXONE SODIUM 1 G IJ SOLR
1.0000 g | Freq: Once | INTRAMUSCULAR | Status: AC
  Administered 2017-09-05: 1 g via INTRAVENOUS
  Filled 2017-09-04: qty 10

## 2017-09-04 MED ORDER — METHYLPREDNISOLONE SODIUM SUCC 125 MG IJ SOLR
125.0000 mg | Freq: Once | INTRAMUSCULAR | Status: AC
  Administered 2017-09-04: 125 mg via INTRAVENOUS

## 2017-09-04 MED ORDER — METHYLPREDNISOLONE SODIUM SUCC 125 MG IJ SOLR
INTRAMUSCULAR | Status: AC
  Administered 2017-09-04: 125 mg via INTRAVENOUS
  Filled 2017-09-04: qty 2

## 2017-09-04 MED ORDER — SODIUM CHLORIDE 0.9 % IV BOLUS (SEPSIS)
250.0000 mL | Freq: Once | INTRAVENOUS | Status: AC
  Administered 2017-09-05: 250 mL via INTRAVENOUS

## 2017-09-04 MED ORDER — SODIUM CHLORIDE 0.9 % IV BOLUS (SEPSIS)
1000.0000 mL | Freq: Once | INTRAVENOUS | Status: AC
  Administered 2017-09-05: 1000 mL via INTRAVENOUS

## 2017-09-04 NOTE — ED Triage Notes (Signed)
Pt arrived via EMS from home d/t difficulty breathing since this morning. Pt reports left rib cage. Pt has hx of chronic COPD. Pt is A&O x4 at this time.

## 2017-09-04 NOTE — ED Provider Notes (Signed)
[facility] Emergency Department Provider Note    First MD Initiated Contact with Patient 09/04/17 2227     (approximate)  I have reviewed the triage vital signs and the nursing notes.   HISTORY  Chief Complaint Shortness of Breath and Respiratory Distress    HPI Stacy Zhang is a 67 y.o. female a history of COPD not on home oxygen who presents with 1 day worsening shortness of breath cough.  Patient called EMS this morning was given nebulizer treatment and felt better so she refused transport to the ER.  Symptoms became worse again this afternoon.  Was unable to breathe.  Patient found to be hypoxic by EMS and brought into the ER after getting nebulizer treatment.  No recent antibiotic treatments.  Denies any chest pain.  No lower extremity swelling.  Patient is very frail and almost cachectic appearing.  States she does not wear home oxygen.  States that she is been using his her home nebulizer treatments about every 3 hours without improvement.  Past Medical History:  Diagnosis Date  . COPD (chronic obstructive pulmonary disease) ([place])    No family history on file. No past surgical history on file. There are no active problems to display for this patient.     Prior to Admission medications   Medication Sig Start Date End Date Taking? Authorizing Provider  COMBIVENT RESPIMAT 20-100 MCG/ACT AERS respimat Inhale 2 puffs into the lungs 2 (two) times daily. 08/10/17  Yes [provider]  fluticasone (FLONASE) 50 MCG/ACT nasal spray Place 1 spray into both nostrils daily. 08/10/17  Yes [provider]  sulfamethoxazole-trimethoprim (BACTRIM DS,SEPTRA DS) 800-160 MG tablet Take 1 tablet by mouth 2 (two) times daily. Patient not taking: Reported on 09/04/2017 05/15/16   Hagler, Jami L, PA-C  valACYclovir (VALTREX) 1000 MG tablet Take 1 tablet (1,000 mg total) by mouth 3 (three) times daily. Patient not taking: Reported on 09/04/2017 05/15/16    Hagler, Jami L, PA-C    Allergies Patient has no known allergies.    Social History Social History   Tobacco Use  . Smoking status: Former Research scientist (life sciences)  . Smokeless tobacco: Never Used  Substance Use Topics  . Alcohol use: No  . Drug use: Not on file    Review of Systems Patient denies headaches, rhinorrhea, blurry vision, numbness, shortness of breath, chest pain, edema, cough, abdominal pain, nausea, vomiting, diarrhea, dysuria, fevers, rashes or hallucinations unless otherwise stated above in HPI. ____________________________________________   PHYSICAL EXAM:  VITAL SIGNS: Vitals:   09/04/17 2232  BP: (!) 145/80  Pulse: (!) 127  Resp: (!) 22  Temp: 99.3 F (37.4 C)  SpO2: 95%    Constitutional: Alert and oriented.cachectic, Critically ill appearing in moderate respiratory distress Eyes: Conjunctivae are normal.  Head: Atraumatic. Nose: No congestion/rhinnorhea. Mouth/Throat: Mucous membranes are moist.   Neck: No stridor. Painless ROM.  Cardiovascular: tachycardic rate, regular rhythm. Grossly normal heart sounds.  Good peripheral circulation. Respiratory: tachypnea, hypoxic on room air, diminished breathsounds throughout, absent in bases posteriorly Gastrointestinal: Soft and nontender. No distention. No abdominal bruits. No CVA tenderness. Genitourinary:  Musculoskeletal: No lower extremity tenderness nor edema.  No joint effusions. Neurologic:  Normal speech and language. No gross focal neurologic deficits are appreciated. No facial droop Skin:  Skin is warm, dry and intact. No rash noted. Psychiatric: Mood and affect are normal. Speech and behavior are normal.  ____________________________________________   LABS (all labs ordered are listed, but only abnormal results are  displayed)  Results for orders placed or performed during the hospital encounter of 09/04/17 (from the past 24 hour(s))  Influenza panel by PCR (type A & B)     Status: None   Collection  Time: 09/04/17 10:22 PM  Result Value Ref Range   Influenza A By PCR NEGATIVE NEGATIVE   Influenza B By PCR NEGATIVE NEGATIVE  CBC with Differential/Platelet     Status: Abnormal   Collection Time: 09/04/17 10:22 PM  Result Value Ref Range   WBC 37.5 (H) 3.6 - 11.0 K/uL   RBC 4.25 3.80 - 5.20 MIL/uL   Hemoglobin 11.8 (L) 12.0 - 16.0 g/dL   HCT 36.5 35.0 - 47.0 %   MCV 85.9 80.0 - 100.0 fL   MCH 27.7 26.0 - 34.0 pg   MCHC 32.2 32.0 - 36.0 g/dL   RDW 14.5 11.5 - 14.5 %   Platelets 432 150 - 440 K/uL   Neutrophils Relative % 91 %   Neutro Abs 34.1 (H) 1.4 - 6.5 K/uL   Lymphocytes Relative 4 %   Lymphs Abs 1.4 1.0 - 3.6 K/uL   Monocytes Relative 5 %   Monocytes Absolute 1.9 (H) 0.2 - 0.9 K/uL   Eosinophils Relative 0 %   Eosinophils Absolute 0.0 0 - 0.7 K/uL   Basophils Relative 0 %   Basophils Absolute 0.1 0 - 0.1 K/uL  Comprehensive metabolic panel     Status: Abnormal   Collection Time: 09/04/17 10:22 PM  Result Value Ref Range   Sodium 137 135 - 145 mmol/L   Potassium 4.0 3.5 - 5.1 mmol/L   Chloride 102 101 - 111 mmol/L   CO2 23 22 - 32 mmol/L   Glucose, Bld 110 (H) 65 - 99 mg/dL   BUN 16 6 - 20 mg/dL   Creatinine, Ser 1.08 (H) 0.44 - 1.00 mg/dL   Calcium 8.8 (L) 8.9 - 10.3 mg/dL   Total Protein 7.8 6.5 - 8.1 g/dL   Albumin 3.0 (L) 3.5 - 5.0 g/dL   AST 67 (H) 15 - 41 U/L   ALT 24 14 - 54 U/L   Alkaline Phosphatase 106 38 - 126 U/L   Total Bilirubin 0.7 0.3 - 1.2 mg/dL   GFR calc non Af Amer 52 (L) >60 mL/min   GFR calc Af Amer >60 >60 mL/min   Anion gap 12 5 - 15  Troponin I     Status: Abnormal   Collection Time: 09/04/17 10:22 PM  Result Value Ref Range   Troponin I 0.03 (HH) <0.03 ng/mL   ____________________________________________  EKG My review and personal interpretation at Time: 22:35   Indication: resp distress  Rate: 130  Rhythm: sinus Axis: normal Other: poor r wave progression, non specific st  changes ____________________________________________  RADIOLOGY  I personally reviewed all radiographic images ordered to evaluate for the above acute complaints and reviewed radiology reports and findings.  These findings were personally discussed with the patient.  Please see medical record for radiology report.  ____________________________________________   PROCEDURES  Procedure(s) performed:  .Critical Care Performed by: Merlyn Lot, MD Authorized by: Merlyn Lot, MD   Critical care provider statement:    Critical care time (minutes):  30   Critical care time was exclusive of:  Separately billable procedures and treating other patients   Critical care was time spent personally by me on the following activities:  Development of treatment plan with patient or surrogate, discussions with consultants, evaluation of patient's response to treatment, examination  of patient, obtaining history from patient or surrogate, ordering and performing treatments and interventions, ordering and review of laboratory studies, ordering and review of radiographic studies, pulse oximetry, re-evaluation of patient's condition and review of old [place] performed: yes ____________________________________________   INITIAL IMPRESSION / ASSESSMENT AND PLAN / ED COURSE  Pertinent labs & imaging results that were available during my care of the patient were reviewed by me and considered in my medical decision making (see chart for details).  DDX: Asthma, copd, CHF, pna, ptx, malignancy, Pe, anemia   HEILY CARLUCCI is a 67 y.o. who presents to the ED with respiratory distress with hypoxia as described above.  Patient is a very frail-appearing.  Currently protecting her airway and her oxygenation improved with supplemental oxygen.  Will give nebulizer treatments.  Will give IV steroids and IV fluids.  Will be sent for the above differential.  Seems less consistent with heart  failure.  More likely COPD or underlying pneumonia.    ----------------------------------------- 11:51 PM on 09/04/2017 -----------------------------------------  Blood work disorder seems most consistent with multifocal pneumonia.  Will start broad-spectrum antibiotics.  We will continue with treatment for underlying emphysema COPD.  Seems less consistent with CHF.  Is on her presentation persistent shortness of breath and increased work of breath and hypoxia to 90% on 4 L nasal cannula will place on BiPAP ABG as well as CT angiogram to rule out PE.  This is most clinically consistent patient does not appears well does not seem to have much reserve.  Spoke with hospitalist who kindly agrees to admit patient.  ____________________________________________   FINAL CLINICAL IMPRESSION(S) / ED DIAGNOSES  Final diagnoses:  Acute respiratory failure with hypoxia ([place])  Multifocal pneumonia      NEW MEDICATIONS STARTED DURING THIS VISIT:  New Prescriptions   No medications on file     Note:  This document was prepared using Dragon voice recognition software and may include unintentional dictation errors.    Merlyn Lot, MD 09/04/17 [phone]

## 2017-09-05 ENCOUNTER — Other Ambulatory Visit: Payer: Self-pay

## 2017-09-05 ENCOUNTER — Emergency Department: Payer: Medicare Other

## 2017-09-05 ENCOUNTER — Encounter: Payer: Self-pay | Admitting: Radiology

## 2017-09-05 DIAGNOSIS — C539 Malignant neoplasm of cervix uteri, unspecified: Secondary | ICD-10-CM | POA: Diagnosis present

## 2017-09-05 DIAGNOSIS — R54 Age-related physical debility: Secondary | ICD-10-CM | POA: Diagnosis present

## 2017-09-05 DIAGNOSIS — R64 Cachexia: Secondary | ICD-10-CM | POA: Diagnosis present

## 2017-09-05 DIAGNOSIS — Z66 Do not resuscitate: Secondary | ICD-10-CM | POA: Diagnosis present

## 2017-09-05 DIAGNOSIS — F419 Anxiety disorder, unspecified: Secondary | ICD-10-CM | POA: Diagnosis present

## 2017-09-05 DIAGNOSIS — A419 Sepsis, unspecified organism: Secondary | ICD-10-CM | POA: Diagnosis present

## 2017-09-05 DIAGNOSIS — J9621 Acute and chronic respiratory failure with hypoxia: Secondary | ICD-10-CM | POA: Diagnosis present

## 2017-09-05 DIAGNOSIS — E876 Hypokalemia: Secondary | ICD-10-CM | POA: Diagnosis not present

## 2017-09-05 DIAGNOSIS — C787 Secondary malignant neoplasm of liver and intrahepatic bile duct: Secondary | ICD-10-CM | POA: Diagnosis present

## 2017-09-05 DIAGNOSIS — N1330 Unspecified hydronephrosis: Secondary | ICD-10-CM | POA: Diagnosis present

## 2017-09-05 DIAGNOSIS — M25551 Pain in right hip: Secondary | ICD-10-CM | POA: Diagnosis not present

## 2017-09-05 DIAGNOSIS — N179 Acute kidney failure, unspecified: Secondary | ICD-10-CM | POA: Diagnosis present

## 2017-09-05 DIAGNOSIS — E43 Unspecified severe protein-calorie malnutrition: Secondary | ICD-10-CM | POA: Diagnosis present

## 2017-09-05 DIAGNOSIS — I214 Non-ST elevation (NSTEMI) myocardial infarction: Secondary | ICD-10-CM | POA: Diagnosis present

## 2017-09-05 DIAGNOSIS — Z681 Body mass index (BMI) 19 or less, adult: Secondary | ICD-10-CM | POA: Diagnosis not present

## 2017-09-05 DIAGNOSIS — J441 Chronic obstructive pulmonary disease with (acute) exacerbation: Secondary | ICD-10-CM | POA: Diagnosis present

## 2017-09-05 DIAGNOSIS — J9601 Acute respiratory failure with hypoxia: Secondary | ICD-10-CM | POA: Diagnosis present

## 2017-09-05 DIAGNOSIS — J189 Pneumonia, unspecified organism: Secondary | ICD-10-CM | POA: Diagnosis present

## 2017-09-05 DIAGNOSIS — C7951 Secondary malignant neoplasm of bone: Secondary | ICD-10-CM | POA: Diagnosis present

## 2017-09-05 DIAGNOSIS — R6520 Severe sepsis without septic shock: Secondary | ICD-10-CM | POA: Diagnosis present

## 2017-09-05 DIAGNOSIS — Z87891 Personal history of nicotine dependence: Secondary | ICD-10-CM | POA: Diagnosis not present

## 2017-09-05 DIAGNOSIS — Z515 Encounter for palliative care: Secondary | ICD-10-CM | POA: Diagnosis present

## 2017-09-05 DIAGNOSIS — R000 Tachycardia, unspecified: Secondary | ICD-10-CM | POA: Diagnosis present

## 2017-09-05 DIAGNOSIS — Z79899 Other long term (current) drug therapy: Secondary | ICD-10-CM | POA: Diagnosis not present

## 2017-09-05 LAB — BASIC METABOLIC PANEL
ANION GAP: 9 (ref 5–15)
BUN: 15 mg/dL (ref 6–20)
CHLORIDE: 103 mmol/L (ref 101–111)
CO2: 23 mmol/L (ref 22–32)
Calcium: 7.7 mg/dL — ABNORMAL LOW (ref 8.9–10.3)
Creatinine, Ser: 1.1 mg/dL — ABNORMAL HIGH (ref 0.44–1.00)
GFR calc non Af Amer: 51 mL/min — ABNORMAL LOW (ref >60)
GFR, EST AFRICAN AMERICAN: 59 mL/min — AB (ref >60)
GLUCOSE: 155 mg/dL — AB (ref 65–99)
Potassium: 3.3 mmol/L — ABNORMAL LOW (ref 3.5–5.1)
Sodium: 135 mmol/L (ref 135–145)

## 2017-09-05 LAB — BLOOD GAS, VENOUS
ACID-BASE DEFICIT: 3.8 mmol/L — AB (ref 0.0–2.0)
BICARBONATE: 21.5 mmol/L (ref 20.0–28.0)
O2 Saturation: 63.7 %
PCO2 VEN: 39 mmHg — AB (ref 44.0–60.0)
Patient temperature: 37
pH, Ven: 7.35 (ref 7.250–7.430)
pO2, Ven: 35 mmHg (ref 32.0–45.0)

## 2017-09-05 LAB — CBC
HEMATOCRIT: 33.7 % — AB (ref 35.0–47.0)
HEMOGLOBIN: 10.9 g/dL — AB (ref 12.0–16.0)
MCH: 28 pg (ref 26.0–34.0)
MCHC: 32.3 g/dL (ref 32.0–36.0)
MCV: 86.9 fL (ref 80.0–100.0)
Platelets: 392 10*3/uL (ref 150–440)
RBC: 3.89 MIL/uL (ref 3.80–5.20)
RDW: 14.5 % (ref 11.5–14.5)
WBC: 28.1 10*3/uL — ABNORMAL HIGH (ref 3.6–11.0)

## 2017-09-05 LAB — LACTIC ACID, PLASMA
Lactic Acid, Venous: 2.2 mmol/L (ref 0.5–1.9)
Lactic Acid, Venous: 2.6 mmol/L (ref 0.5–1.9)
Lactic Acid, Venous: 3.2 mmol/L (ref 0.5–1.9)

## 2017-09-05 LAB — GLUCOSE, CAPILLARY: Glucose-Capillary: 176 mg/dL — ABNORMAL HIGH (ref 65–99)

## 2017-09-05 LAB — TROPONIN I: TROPONIN I: 0.03 ng/mL — AB (ref <0.03)

## 2017-09-05 MED ORDER — ONDANSETRON HCL 4 MG/2ML IJ SOLN: 4.0000 mg | Freq: Four times a day (QID) | INTRAMUSCULAR | Status: DC | PRN

## 2017-09-05 MED ORDER — DEXTROSE 5 % IV SOLN
500.0000 mg | INTRAVENOUS | Status: DC
  Administered 2017-09-05 – 2017-09-08 (×4): 500 mg via INTRAVENOUS
  Filled 2017-09-05 (×5): qty 500

## 2017-09-05 MED ORDER — FLUTICASONE PROPIONATE 50 MCG/ACT NA SUSP
1.0000 | Freq: Every day | NASAL | Status: DC
Start: 2017-09-05 — End: 2017-09-15
  Administered 2017-09-06 – 2017-09-14 (×9): 1 via NASAL
  Filled 2017-09-05: qty 16

## 2017-09-05 MED ORDER — IOPAMIDOL (ISOVUE-370) INJECTION 76%
75.0000 mL | Freq: Once | INTRAVENOUS | Status: AC | PRN
  Administered 2017-09-05: 75 mL via INTRAVENOUS

## 2017-09-05 MED ORDER — INFLUENZA VAC SPLIT HIGH-DOSE 0.5 ML IM SUSY
0.5000 mL | PREFILLED_SYRINGE | INTRAMUSCULAR | Status: DC
  Filled 2017-09-05: qty 0.5

## 2017-09-05 MED ORDER — IPRATROPIUM-ALBUTEROL 0.5-2.5 (3) MG/3ML IN SOLN
3.0000 mL | Freq: Four times a day (QID) | RESPIRATORY_TRACT | Status: DC
  Administered 2017-09-05 – 2017-09-11 (×27): 3 mL via RESPIRATORY_TRACT
  Filled 2017-09-05 (×28): qty 3

## 2017-09-05 MED ORDER — ACETAMINOPHEN 325 MG PO TABS
650.0000 mg | ORAL_TABLET | Freq: Four times a day (QID) | ORAL | Status: DC | PRN
Start: 2017-09-05 — End: 2017-09-15

## 2017-09-05 MED ORDER — HYDROCODONE-ACETAMINOPHEN 5-325 MG PO TABS
1.0000 | ORAL_TABLET | ORAL | Status: DC | PRN
  Administered 2017-09-05 – 2017-09-13 (×7): 1 via ORAL
  Administered 2017-09-13 – 2017-09-14 (×3): 2 via ORAL
  Administered 2017-09-14: 1 via ORAL
  Filled 2017-09-05: qty 2
  Filled 2017-09-05 (×2): qty 1
  Filled 2017-09-05: qty 2
  Filled 2017-09-05 (×2): qty 1
  Filled 2017-09-05: qty 2
  Filled 2017-09-05 (×4): qty 1

## 2017-09-05 MED ORDER — HEPARIN SODIUM (PORCINE) 5000 UNIT/ML IJ SOLN
5000.0000 [IU] | Freq: Three times a day (TID) | INTRAMUSCULAR | Status: DC
  Administered 2017-09-05 – 2017-09-07 (×9): 5000 [IU] via SUBCUTANEOUS
  Filled 2017-09-05 (×9): qty 1

## 2017-09-05 MED ORDER — BISACODYL 5 MG PO TBEC
5.0000 mg | DELAYED_RELEASE_TABLET | Freq: Every day | ORAL | Status: DC | PRN
  Administered 2017-09-12 – 2017-09-15 (×2): 5 mg via ORAL
  Filled 2017-09-05 (×2): qty 1

## 2017-09-05 MED ORDER — DEXTROSE 5 % IV SOLN
1.0000 g | INTRAVENOUS | Status: AC
  Administered 2017-09-05 – 2017-09-11 (×7): 1 g via INTRAVENOUS
  Filled 2017-09-05 (×7): qty 10

## 2017-09-05 MED ORDER — DOCUSATE SODIUM 100 MG PO CAPS
100.0000 mg | ORAL_CAPSULE | Freq: Two times a day (BID) | ORAL | Status: DC
  Administered 2017-09-05 – 2017-09-15 (×22): 100 mg via ORAL
  Filled 2017-09-05 (×22): qty 1

## 2017-09-05 MED ORDER — ONDANSETRON HCL 4 MG PO TABS: 4.0000 mg | ORAL_TABLET | Freq: Four times a day (QID) | ORAL | Status: DC | PRN

## 2017-09-05 MED ORDER — PANTOPRAZOLE SODIUM 40 MG IV SOLR
40.0000 mg | Freq: Two times a day (BID) | INTRAVENOUS | Status: DC
  Administered 2017-09-05 – 2017-09-12 (×14): 40 mg via INTRAVENOUS
  Filled 2017-09-05 (×14): qty 40

## 2017-09-05 MED ORDER — SODIUM CHLORIDE 0.9 % IV BOLUS (SEPSIS)
500.0000 mL | Freq: Once | INTRAVENOUS | Status: AC
  Administered 2017-09-05: 500 mL via INTRAVENOUS

## 2017-09-05 MED ORDER — ACETAMINOPHEN 650 MG RE SUPP: 650.0000 mg | Freq: Four times a day (QID) | RECTAL | Status: DC | PRN

## 2017-09-05 MED ORDER — SODIUM CHLORIDE 0.9 % IV SOLN
INTRAVENOUS | Status: DC
  Administered 2017-09-05 (×3): via INTRAVENOUS

## 2017-09-05 MED ORDER — TRAZODONE HCL 50 MG PO TABS
25.0000 mg | ORAL_TABLET | Freq: Every evening | ORAL | Status: DC | PRN
  Administered 2017-09-12 – 2017-09-13 (×2): 25 mg via ORAL
  Filled 2017-09-05 (×2): qty 1

## 2017-09-05 NOTE — Progress Notes (Signed)
Bladder scanned patient again.  620 mL in bladder.  MD notified via text page.

## 2017-09-05 NOTE — Clinical Social Work Note (Signed)
CSW received consult for resource needs. CSW will assess when able.  Santiago Bumpers, MSW, Latanya Presser [phone]

## 2017-09-05 NOTE — Progress Notes (Signed)
CODE SEPSIS - PHARMACY COMMUNICATION  **Broad Spectrum Antibiotics should be administered within 1 hour of Sepsis diagnosis**  Time Code Sepsis Called/Page Received: 2320  Antibiotics Ordered: azithromycin/ceftriaxone  Time of 1st antibiotic administration: 0003  Additional action taken by pharmacy:   If necessary, Name of Provider/Nurse Contacted:     Tobie Lords ,PharmD Clinical Pharmacist  09/05/2017  12:09 AM

## 2017-09-05 NOTE — Progress Notes (Signed)
Admitted this morning for sepsis, bilateral pneumonia, acute respiratory failure secondary to pneumonia.  She is on oxygen, IV fluids, IV antibiotics, lactic acid level is coming down.  She still has lots of cough.  And also noted to have urine retention. Physical exam ; oxygen saturation 94% on 2 L, heart rate around 95, temp 97.34f, blood pressure 121/63.  Cardiovascular system: S1, S2 regular Lungs: Patient has faint bilateral expiratory wheeze but otherwise mostly clear. Assessment and plan: #1 sepsis secondary to bilateral pneumonia: Lactic acid level is coming down, continue aggressive hydration, IV antibiotics. 2.  Acute respiratory failure secondary to pneumonia: Continue oxygen and wean off oxygen as tolerated, check ambulatory oxygen level before discharge. 3.  COPD: Patient used to smoke heavy but no quit 20 years ago.  No wheezing. 4.  Urine retention: Around more than 500 mL urine present on bladder scan, doing PUre wick  time Spent 30 minutes.Marland Kitchen

## 2017-09-05 NOTE — Progress Notes (Signed)
Patient has 506 mL in bladder per bladder scanner.  Will place PureWick per [place].

## 2017-09-05 NOTE — ED Notes (Signed)
Attempted report; was told nurse would call back. 

## 2017-09-05 NOTE — Progress Notes (Signed)
Pharmacy Antibiotic Note  Stacy Zhang is a 67 y.o. female admitted on 09/04/2017 with sepsis s/t CAP pneumonia.  Pharmacy has been consulted for azithromycin/ceftriaxone dosing.  Plan: Patient received azithromycin 500 mg and ceftriaxone 1g IV x 1 in ED  Will continue azithromycin 500 mg and ceftriaxone 1g IV daily 02/01 EKG QTc 470  Height: 5\' 2"  (157.5 cm) Weight: 90 lb (40.8 kg) IBW/kg (Calculated) : 50.1  Temp (24hrs), Avg:99.3 F (37.4 C), Min:99.3 F (37.4 C), Max:99.3 F (37.4 C)  Recent Labs  Lab 09/04/17 2222 09/04/17 2355  WBC 37.5*  --   CREATININE 1.08*  --   LATICACIDVEN  --  2.2*    Estimated Creatinine Clearance: 33 mL/min (A) (by C-G formula based on SCr of 1.08 mg/dL (H)).    No Known Allergies  Thank you for allowing pharmacy to be a part of this patient's care.  Tobie Lords, PharmD, BCPS Clinical Pharmacist 09/05/2017

## 2017-09-05 NOTE — H&P (Signed)
[facility] [place] NAME: Stacy Zhang    MR#:  [id number]  DATE OF BIRTH:  Mar 01, 1951  DATE OF ADMISSION:  09/04/2017  PRIMARY CARE PHYSICIAN: Letta Median, MD   REQUESTING/REFERRING PHYSICIAN:   CHIEF COMPLAINT:   Chief Complaint  Patient presents with  . Shortness of Breath  . Respiratory Distress    HISTORY OF PRESENT ILLNESS: Stacy Zhang  is a 67 y.o. female with a known history of COPD, not on home oxygen.  Patient is a former smoker, she quit cigarettes 20 years ago. She came to emergency room via EMS for acute onset of shortness of breath and productive cough started in the past 24-48 hours.  She used her nebulizer treatment, at home every 3 hours, without much improvement.  Patient feels very weak, exhausted.  Patient has left-sided chest pain, under the ribs with cough and deep inspiration. She says she felt hot, but she did not check her temperature, at home.  She denies any nausea, vomiting, diarrhea or bleeding. Upon evaluation in the emergency room, patient was found to be hypoxemic.  She currently requires 3.5 L per nasal cannula to maintain 94% oxygen saturation.  Temperature was 100.3 and heart rate still is in 120s.  Blood tests are remarkable for elevated WBC at 37,000 and elevated lactic acid at 2.2.  Troponin level is 0.03. CAT scan of the chest is negative for any PE but confirms bilateral pneumonia. Patient is admitted for further evaluation and treatment  PAST MEDICAL HISTORY:   Past Medical History:  Diagnosis Date  . COPD (chronic obstructive pulmonary disease) ([place])     PAST SURGICAL HISTORY: No past surgical history on file.  SOCIAL HISTORY:  Social History   Tobacco Use  . Smoking status: Former Research scientist (life sciences)  . Smokeless tobacco: Never Used  Substance Use Topics  . Alcohol use: No    FAMILY HISTORY: No family history on file.  DRUG ALLERGIES: No Known Allergies  REVIEW OF SYSTEMS:    CONSTITUTIONAL: Positive for fever, fatigue and generalized weakness.  EYES: No blurred or double vision.  EARS, NOSE, AND THROAT: No tinnitus or ear pain.  RESPIRATORY: Positive for productive cough, shortness of breath and wheezing.  No hemoptysis.  CARDIOVASCULAR: There is left-sided chest pain under the ribs with cough and deep inspiration.  No orthopnea or edema.  GASTROINTESTINAL: No nausea, vomiting, diarrhea or abdominal pain.  GENITOURINARY: No dysuria, hematuria.  ENDOCRINE: No polyuria, nocturia,  HEMATOLOGY: No bleeding SKIN: No rash or lesion. MUSCULOSKELETAL: No joint pain.   NEUROLOGIC: No focal weakness.  PSYCHIATRY: No anxiety or depression.   MEDICATIONS AT HOME:  Prior to Admission medications   Medication Sig Start Date End Date Taking? Authorizing Provider  COMBIVENT RESPIMAT 20-100 MCG/ACT AERS respimat Inhale 2 puffs into the lungs 2 (two) times daily. 08/10/17  Yes [provider]  fluticasone (FLONASE) 50 MCG/ACT nasal spray Place 1 spray into both nostrils daily. 08/10/17  Yes [provider]  sulfamethoxazole-trimethoprim (BACTRIM DS,SEPTRA DS) 800-160 MG tablet Take 1 tablet by mouth 2 (two) times daily. Patient not taking: Reported on 09/04/2017 05/15/16   Hagler, Jami L, PA-C  valACYclovir (VALTREX) 1000 MG tablet Take 1 tablet (1,000 mg total) by mouth 3 (three) times daily. Patient not taking: Reported on 09/04/2017 05/15/16   Hagler, Jami L, PA-C      PHYSICAL EXAMINATION:   VITAL SIGNS: Blood pressure (!) 127/58, pulse (!) 126, temperature 99.3 F (37.4  C), temperature source Oral, resp. rate 20, height 5\' 2"  (1.575 m), weight 40.8 kg (90 lb), SpO2 96 %.  GENERAL:  67 y.o.-year-old patient lying in the bed, and in mild distress, secondary to shortness of breath and cough.  EYES: Pupils equal, round, reactive to light and accommodation. No scleral icterus. Extraocular muscles intact.  HEENT: Head atraumatic, normocephalic. Oropharynx and  nasopharynx clear.  NECK:  Supple, no jugular venous distention. No thyroid enlargement, no tenderness.  LUNGS: Use breath sounds and wheezing noted bilaterally.  CARDIOVASCULAR: S1, S2 normal. No S3/S4.  ABDOMEN: Soft, nontender, nondistended. Bowel sounds present. No organomegaly or mass.  EXTREMITIES: No pedal edema, cyanosis, or clubbing.  NEUROLOGIC: No focal weakness. Gait not checked, as patient is too weak to ambulate.  PSYCHIATRIC: The patient is alert and oriented x 3.  SKIN: No obvious rash, lesion, or ulcer.   LABORATORY PANEL:   CBC Recent Labs  Lab 09/04/17 2222  WBC 37.5*  HGB 11.8*  HCT 36.5  PLT 432  MCV 85.9  MCH 27.7  MCHC 32.2  RDW 14.5  LYMPHSABS 1.4  MONOABS 1.9*  EOSABS 0.0  BASOSABS 0.1   ------------------------------------------------------------------------------------------------------------------  Chemistries  Recent Labs  Lab 09/04/17 2222  NA 137  K 4.0  CL 102  CO2 23  GLUCOSE 110*  BUN 16  CREATININE 1.08*  CALCIUM 8.8*  AST 67*  ALT 24  ALKPHOS 106  BILITOT 0.7   ------------------------------------------------------------------------------------------------------------------ estimated creatinine clearance is 33 mL/min (A) (by C-G formula based on SCr of 1.08 mg/dL (H)). ------------------------------------------------------------------------------------------------------------------ No results for input(s): TSH, T4TOTAL, T3FREE, THYROIDAB in the last 72 hours.  Invalid input(s): FREET3   Coagulation profile No results for input(s): INR, PROTIME in the last 168 hours. ------------------------------------------------------------------------------------------------------------------- No results for input(s): DDIMER in the last 72 hours. -------------------------------------------------------------------------------------------------------------------  Cardiac Enzymes Recent Labs  Lab 09/04/17 2222  TROPONINI 0.03*    ------------------------------------------------------------------------------------------------------------------ Invalid input(s): POCBNP  ---------------------------------------------------------------------------------------------------------------  Urinalysis    Component Value Date/Time   COLORURINE Yellow 05/03/2012 2100   APPEARANCEUR Turbid 05/03/2012 2100   LABSPEC 1.024 05/03/2012 2100   PHURINE 6.0 05/03/2012 2100   GLUCOSEU Negative 05/03/2012 2100   HGBUR 1+ 05/03/2012 2100   BILIRUBINUR Negative 05/03/2012 2100   KETONESUR Negative 05/03/2012 2100   PROTEINUR 30 mg/dL 05/03/2012 2100   NITRITE Negative 05/03/2012 2100   LEUKOCYTESUR 3+ 05/03/2012 2100     RADIOLOGY: Dg Chest 2 View  Result Date: 09/04/2017 CLINICAL DATA:  Difficulty breathing since this morning. Left rib cage pain. History of chronic COPD. EXAM: CHEST  2 VIEW COMPARISON:  05/02/2012 FINDINGS: Normal heart size and pulmonary vascularity. Emphysematous changes in the lungs with peribronchial thickening and interstitial changes consistent with chronic bronchitis. Since the previous study, there is interval development of infiltration in both lung bases with small bilateral pleural effusions. This could indicate developing multifocal pneumonia or edema. No pneumothorax. Mediastinal contours appear intact. Calcification of the aorta. IMPRESSION: New bilateral basilar infiltrates and effusions superimposed upon chronic emphysematous changes and chronic bronchitic changes. Appearance could indicate developing multifocal pneumonia or edema. Electronically Signed   By: Lucienne Capers M.D.   On: 09/04/2017 23:23   Ct Angio Chest Pe W And/or Wo Contrast  Result Date: 09/05/2017 CLINICAL DATA:  Chest pain, difficulty breathing. EXAM: CT ANGIOGRAPHY CHEST WITH CONTRAST TECHNIQUE: Multidetector CT imaging of the chest was performed using the standard protocol during bolus administration of intravenous contrast.  Multiplanar CT image reconstructions and MIPs were obtained to evaluate the vascular  anatomy. CONTRAST:  43mL ISOVUE-370 IOPAMIDOL (ISOVUE-370) INJECTION 76% COMPARISON:  CT scan of June 18, 2005. FINDINGS: Cardiovascular: Satisfactory opacification of the pulmonary arteries to the segmental level. No evidence of pulmonary embolism. Normal heart size. No pericardial effusion. Mediastinum/Nodes: No enlarged mediastinal, hilar, or axillary lymph nodes. Thyroid gland, trachea, and esophagus demonstrate no significant findings. Lungs/Pleura: No pneumothorax is noted. Emphysematous disease is noted in the upper lobes bilaterally. Bilateral posterior basilar opacities are noted concerning for atelectasis or pneumonia. 6 mm subpleural nodule is seen in right upper lobe best seen on image number 47 of series 6. Upper Abdomen: Severe left hydronephrosis is noted. Nonobstructive right renal calculus is noted. Musculoskeletal: No chest wall abnormality. No acute or significant osseous findings. Review of the MIP images confirms the above findings. IMPRESSION: No definite evidence of pulmonary embolus. Bilateral posterior basilar opacities are noted concerning for pneumonia or atelectasis. 6 mm subpleural nodule seen in right upper lobe. Non-contrast chest CT at 6-12 months is recommended. If the nodule is stable at time of repeat CT, then future CT at 18-24 months (from today's scan) is considered optional for low-risk patients, but is recommended for high-risk patients. This recommendation follows the consensus statement: Guidelines for Management of Incidental Pulmonary Nodules Detected on CT Images: From the Fleischner Society 2017; Radiology 2017; 284:228-243. Severe left hydronephrosis is noted. Nonobstructive right renal calculus is noted. CT urogram is recommended for further evaluation. Emphysema (ICD10-J43.9). Electronically Signed   By: Marijo Conception, M.D.   On: 09/05/2017 00:56    EKG: Orders placed or  performed during the hospital encounter of 09/04/17  . ED EKG  . ED EKG  . EKG 12-Lead  . EKG 12-Lead  . EKG 12-Lead  . EKG 12-Lead    IMPRESSION AND PLAN:  1.  Sepsis, likely secondary to bilateral pneumonia.  We will start oxygen treatment, antibiotics IV and fluid resuscitation. 2.  Acute hypoxemic respiratory failure, secondary to bilateral pneumonia, see treatment as above, under #1. 3.  Bilateral pneumonia, community-acquired.  We will start azithromycin and Rocephin IV. 4.  Acute COPD exacerbation, will treat with steroids nebulizer treatments and oxygen therapy. 5.  Non-ST elevation MI.  Troponin level is minimally elevated at 0.03, likely secondary to demand ischemia.  There are no acute changes on the EKG. We will continue to monitor patient on telemetry and follow troponin level. 6.  Sinus tachycardia, likely due to the infectious process.  This should resolve with IV fluids and antibiotics. We will continue to monitor patient on telemetry. 7.  Right upper lobe nodule, new incidental finding per chest CT. Recommend repeat chest CT in 6 months, for further evaluation.  All the records are reviewed and case discussed with ED provider. Management plans discussed with the patient, family and they are in agreement.  CODE STATUS: Code Status History    This patient does not have a recorded code status. Please follow your organizational policy for patients in this situation.       TOTAL TIME TAKING CARE OF THIS PATIENT: 45 minutes.    Amelia Jo M.D on 09/05/2017 at 1:22 AM  Between 7am to 6pm - Pager - [phone]  After 6pm go to [email or web address] - password [email or web address] [facility]  Office  [phone]  CC: Primary care physician; Letta Median, MD

## 2017-09-05 NOTE — Progress Notes (Signed)
Family Meeting Note  Advance Directive:no  Today a meeting took place with the patient.,  Patient spoke to me, at this time  shewants to be full code.  The following clinical team members were present during this meeting:MD  The following were discussed:Patient's diagnosis: , Patient's progosis: good  and Goals for treatment: Full Code  Additional follow-up to be provided: We will follow daily and make further recommendation.  She told me that she never thought about healthcare living will and DNR status.  Time spent during discussion:30 minutes  Epifanio Lesches, MD

## 2017-09-05 NOTE — Progress Notes (Signed)
MD Pyreddy made aware of pt's new lactic acid of 3.2 and HR in the 120's. An order for a repeated lactic acid level and a normal saline bolus of 569ml was obtained. Will continue to monitor.

## 2017-09-06 ENCOUNTER — Inpatient Hospital Stay: Payer: Medicare Other

## 2017-09-06 LAB — EXPECTORATED SPUTUM ASSESSMENT W REFEX TO RESP CULTURE: SPECIAL REQUESTS: NORMAL

## 2017-09-06 LAB — BASIC METABOLIC PANEL
ANION GAP: 7 (ref 5–15)
BUN: 20 mg/dL (ref 6–20)
CHLORIDE: 111 mmol/L (ref 101–111)
CO2: 22 mmol/L (ref 22–32)
CREATININE: 0.85 mg/dL (ref 0.44–1.00)
Calcium: 7.8 mg/dL — ABNORMAL LOW (ref 8.9–10.3)
GFR calc non Af Amer: >60 mL/min (ref >60)
Glucose, Bld: 90 mg/dL (ref 65–99)
POTASSIUM: 3.5 mmol/L (ref 3.5–5.1)
SODIUM: 140 mmol/L (ref 135–145)

## 2017-09-06 LAB — CBC
HEMATOCRIT: 28.6 % — AB (ref 35.0–47.0)
HEMOGLOBIN: 9.3 g/dL — AB (ref 12.0–16.0)
MCH: 28.5 pg (ref 26.0–34.0)
MCHC: 32.5 g/dL (ref 32.0–36.0)
MCV: 87.7 fL (ref 80.0–100.0)
Platelets: 373 10*3/uL (ref 150–440)
RBC: 3.26 MIL/uL — AB (ref 3.80–5.20)
RDW: 14.6 % — ABNORMAL HIGH (ref 11.5–14.5)
WBC: 22.8 10*3/uL — AB (ref 3.6–11.0)

## 2017-09-06 LAB — GLUCOSE, CAPILLARY: GLUCOSE-CAPILLARY: 101 mg/dL — AB (ref 65–99)

## 2017-09-06 LAB — EXPECTORATED SPUTUM ASSESSMENT W GRAM STAIN, RFLX TO RESP C

## 2017-09-06 MED ORDER — GUAIFENESIN ER 600 MG PO TB12
600.0000 mg | ORAL_TABLET | Freq: Two times a day (BID) | ORAL | Status: DC
  Administered 2017-09-06 – 2017-09-15 (×19): 600 mg via ORAL
  Filled 2017-09-06 (×19): qty 1

## 2017-09-06 MED ORDER — FUROSEMIDE 10 MG/ML IJ SOLN
20.0000 mg | Freq: Once | INTRAMUSCULAR | Status: AC
Start: 2017-09-06 — End: 2017-09-06
  Administered 2017-09-06: 20 mg via INTRAVENOUS
  Filled 2017-09-06: qty 2

## 2017-09-06 MED ORDER — METHYLPREDNISOLONE SODIUM SUCC 125 MG IJ SOLR
60.0000 mg | INTRAMUSCULAR | Status: DC
  Administered 2017-09-06 – 2017-09-13 (×8): 60 mg via INTRAVENOUS
  Filled 2017-09-06 (×10): qty 2

## 2017-09-06 MED ORDER — ALPRAZOLAM 0.5 MG PO TABS
0.5000 mg | ORAL_TABLET | Freq: Two times a day (BID) | ORAL | Status: DC
  Administered 2017-09-06 – 2017-09-15 (×19): 0.5 mg via ORAL
  Filled 2017-09-06 (×19): qty 1

## 2017-09-06 NOTE — Progress Notes (Signed)
[facility] [place] NAME: Peytan Andringa    MR#:  [id number]  DATE OF BIRTH:  07/10/1951  SUBJECTIVE: Seen at bedside, admitted for sepsis   Due to pneumonia.  On IV fluids, patient noted to have urine retention, Foley catheter inserted.  CHIEF COMPLAINT:   Chief Complaint  Patient presents with  . Shortness of Breath  . Respiratory Distress    REVIEW OF SYSTEMS:   ROS CONSTITUTIONAL: No fever, fatigue or weakness.  EYES: No blurred or double vision.  EARS, NOSE, AND THROAT: No tinnitus or ear pain.  RESPIRATORY: Cough, shortness of breath, wheezing.  CARDIOVASCULAR: No chest pain, orthopnea, edema.  GASTROINTESTINAL: No nausea, vomiting, diarrhea or abdominal pain.  GENITOURINARY: No dysuria, hematuria.  ENDOCRINE: No polyuria, nocturia,  HEMATOLOGY: No anemia, easy bruising or bleeding SKIN: No rash or lesion. MUSCULOSKELETAL: No joint pain or arthritis.   NEUROLOGIC: No tingling, numbness, weakness.  PSYCHIATRY: No anxiety or depression.   DRUG ALLERGIES:  No Known Allergies  VITALS:  Blood pressure (!) 126/59, pulse (!) 101, temperature 98.1 F (36.7 C), temperature source Oral, resp. rate 18, height 5\' 2"  (1.575 m), weight 37.4 kg (82 lb 6.4 oz), SpO2 100 %.  PHYSICAL EXAMINATION:  GENERAL:  67 y.o.-year-old patient lying in the bed with no acute distress.  EYES: Pupils equal, round, reactive to light and accommodation. No scleral icterus. Extraocular muscles intact.  HEENT: Head atraumatic, normocephalic. Oropharynx and nasopharynx clear.  NECK:  Supple, no jugular venous distention. No thyroid enlargement, no tenderness.  LUNGS: N faint expiratory wheeze bilaterally. CARDIOVASCULAR: S1, S2 normal. No murmurs, rubs, or gallops.  ABDOMEN: Soft, nontender, nondistended. Bowel sounds present. No organomegaly or mass.  EXTREMITIES: No pedal edema, cyanosis, or clubbing.  NEUROLOGIC: Cranial nerves II through XII are  intact. Muscle strength 5/5 in all extremities. Sensation intact. Gait not checked.  PSYCHIATRIC: The patient is alert and oriented x 3.  SKIN: No obvious rash, lesion, or ulcer.    LABORATORY PANEL:   CBC Recent Labs  Lab 09/05/17 0323  WBC 28.1*  HGB 10.9*  HCT 33.7*  PLT 392   ------------------------------------------------------------------------------------------------------------------  Chemistries  Recent Labs  Lab 09/04/17 2222 09/05/17 0323  NA 137 135  K 4.0 3.3*  CL 102 103  CO2 23 23  GLUCOSE 110* 155*  BUN 16 15  CREATININE 1.08* 1.10*  CALCIUM 8.8* 7.7*  AST 67*  --   ALT 24  --   ALKPHOS 106  --   BILITOT 0.7  --    ------------------------------------------------------------------------------------------------------------------  Cardiac Enzymes Recent Labs  Lab 09/05/17 0323  TROPONINI 0.03*   ------------------------------------------------------------------------------------------------------------------  RADIOLOGY:  Dg Chest 2 View  Result Date: 09/04/2017 CLINICAL DATA:  Difficulty breathing since this morning. Left rib cage pain. History of chronic COPD. EXAM: CHEST  2 VIEW COMPARISON:  05/02/2012 FINDINGS: Normal heart size and pulmonary vascularity. Emphysematous changes in the lungs with peribronchial thickening and interstitial changes consistent with chronic bronchitis. Since the previous study, there is interval development of infiltration in both lung bases with small bilateral pleural effusions. This could indicate developing multifocal pneumonia or edema. No pneumothorax. Mediastinal contours appear intact. Calcification of the aorta. IMPRESSION: New bilateral basilar infiltrates and effusions superimposed upon chronic emphysematous changes and chronic bronchitic changes. Appearance could indicate developing multifocal pneumonia or edema. Electronically Signed   By: Lucienne Capers M.D.   On: 09/04/2017 23:23   Ct Angio Chest Pe W  And/or Wo  Contrast  Result Date: 09/05/2017 CLINICAL DATA:  Chest pain, difficulty breathing. EXAM: CT ANGIOGRAPHY CHEST WITH CONTRAST TECHNIQUE: Multidetector CT imaging of the chest was performed using the standard protocol during bolus administration of intravenous contrast. Multiplanar CT image reconstructions and MIPs were obtained to evaluate the vascular anatomy. CONTRAST:  44mL ISOVUE-370 IOPAMIDOL (ISOVUE-370) INJECTION 76% COMPARISON:  CT scan of June 18, 2005. FINDINGS: Cardiovascular: Satisfactory opacification of the pulmonary arteries to the segmental level. No evidence of pulmonary embolism. Normal heart size. No pericardial effusion. Mediastinum/Nodes: No enlarged mediastinal, hilar, or axillary lymph nodes. Thyroid gland, trachea, and esophagus demonstrate no significant findings. Lungs/Pleura: No pneumothorax is noted. Emphysematous disease is noted in the upper lobes bilaterally. Bilateral posterior basilar opacities are noted concerning for atelectasis or pneumonia. 6 mm subpleural nodule is seen in right upper lobe best seen on image number 47 of series 6. Upper Abdomen: Severe left hydronephrosis is noted. Nonobstructive right renal calculus is noted. Musculoskeletal: No chest wall abnormality. No acute or significant osseous findings. Review of the MIP images confirms the above findings. IMPRESSION: No definite evidence of pulmonary embolus. Bilateral posterior basilar opacities are noted concerning for pneumonia or atelectasis. 6 mm subpleural nodule seen in right upper lobe. Non-contrast chest CT at 6-12 months is recommended. If the nodule is stable at time of repeat CT, then future CT at 18-24 months (from today's scan) is considered optional for low-risk patients, but is recommended for high-risk patients. This recommendation follows the consensus statement: Guidelines for Management of Incidental Pulmonary Nodules Detected on CT Images: From the Fleischner Society 2017; Radiology  2017; 284:228-243. Severe left hydronephrosis is noted. Nonobstructive right renal calculus is noted. CT urogram is recommended for further evaluation. Emphysema (ICD10-J43.9). Electronically Signed   By: Marijo Conception, M.D.   On: 09/05/2017 00:56    EKG:   Orders placed or performed during the hospital encounter of 09/04/17  . ED EKG  . ED EKG  . EKG 12-Lead  . EKG 12-Lead  . EKG 12-Lead  . EKG 12-Lead    ASSESSMENT AND PLAN:   Respiratory failure secondary to bilateral pneumonia: Continue IV antibiotics, oxygen, bronchodilators. 2.  Sepsis present on admission with pneumonia: Continue IV antibiotics, follow sputum cultures, white count.  Lactic acid level is coming down from 3.22.6. 3.  COPD exacerbation: Continue IV steroids, bronchodilators, oxygen.  Check ambulatory oxygen levels before patient goes home. 4.  Hypokalemia: Replace the potassium. 5.  Acute kidney injury due to acute renal failure from sepsis: Continue IV fluids 6.  Urine retention: Continue Foley catheter for today, discontinue Foley and voiding trials tomorrow.     All the records are reviewed and case discussed with Care Management/Social Workerr. Management plans discussed with the patient, family and they are in agreement.  CODE STATUS: Full code  TOTAL TIME TAKING CARE OF THIS PATIENT: 58minutes.   POSSIBLE D/C IN 1-2 DAYS, DEPENDING ON CLINICAL CONDITION.   Epifanio Lesches M.D on 09/06/2017 at 12:36 PM  Between 7am to 6pm - Pager - [phone]  After 6pm go to [email or web address] - password [email or web address] [facility]  Office  [phone]  CC: Primary care physician; Letta Median, MD   Note: This dictation was prepared with Dragon dictation along with smaller phrase technology. Any transcriptional errors that result from this process are unintentional.

## 2017-09-06 NOTE — Clinical Social Work Note (Signed)
Clinical Social Work Assessment  Patient Details  Name: Stacy Zhang MRN: [id number] Date of Birth: Jun 10, 1951  Date of referral:  09/06/17               Reason for consult:  Financial Concerns, Transportation                Permission sought to share information with:    Permission granted to share information::     Name::        Agency::     Relationship::     Contact Information:     Housing/Transportation Living arrangements for the past 2 months:  Single Family Home Source of Information:  Patient Patient Interpreter Needed:  None Criminal Activity/Legal Involvement Pertinent to Current Situation/Hospitalization:  No - Comment as needed Significant Relationships:  Warehouse manager, Significant Other Lives with:  Significant Other Do you feel safe going back to the place where you live?  Yes Need for family participation in patient care:  No (Coment)  Care giving concerns:  Consult from EDP that the patient has transportation needs and financial barriers in the community.   Social Worker assessment / plan:  The CSW met with the patient at bedside to discuss her request for resource information. The patient stated that she had not asked for social work to visit. The CSW advised the patient that the consult indicated that she had recently ended her relationship with her domestic partner. The patient reported that she did not remember saying that in the ED, and she was expecting her domestic partner to visit the hospital. The CSW inquired if the patient would like a Tourist information centre manager, to which the patient replied that she would "just in case". The CSW highlighted transportation resources as well as [employer]. The patient denied any history of domestic violence or verbal abuse in the relationship. The CSW validated the patient's assertion.  CSW is signing off. Please consult should additional needs arise.   Employment status:  Retired Forensic scientist:   Medicare PT Recommendations:  Not assessed at this time [place] / Referral to community resources:  Other (Comment Required)(Provided Tourist information centre manager)  Patient/Family's Response to care:  The patient thanked the CSW.  Patient/Family's Understanding of and Emotional Response to Diagnosis, Current Treatment, and Prognosis: The patient indicated that she had no social needs.   Emotional Assessment Appearance:  Appears stated age Attitude/Demeanor/Rapport:  Engaged Affect (typically observed):  Appropriate, Pleasant Orientation:  Oriented to Self, Oriented to Place, Oriented to  Time, Oriented to Situation Alcohol / Substance use:  Never Used Psych involvement (Current and /or in the community):  No (Comment)  Discharge Needs  Concerns to be addressed:  No discharge needs identified Readmission within the last 30 days:  No Current discharge risk:  Chronically ill Barriers to Discharge:  Continued Medical Work up   [employer], LCSW 09/06/2017, 2:22 PM

## 2017-09-06 NOTE — Progress Notes (Signed)
Checked on patient during hourly rounding, patient sleeping peacefully in bed. Will continue to check on patient throughout the remainder of the shift. Wenda Low [facility]

## 2017-09-07 LAB — GLUCOSE, CAPILLARY: Glucose-Capillary: 89 mg/dL (ref 65–99)

## 2017-09-07 MED ORDER — ENSURE ENLIVE PO LIQD
237.0000 mL | Freq: Three times a day (TID) | ORAL | Status: DC
  Administered 2017-09-07 – 2017-09-15 (×11): 237 mL via ORAL

## 2017-09-07 MED ORDER — ADULT MULTIVITAMIN W/MINERALS CH
1.0000 | ORAL_TABLET | Freq: Every day | ORAL | Status: DC
  Administered 2017-09-07 – 2017-09-15 (×9): 1 via ORAL
  Filled 2017-09-07 (×9): qty 1

## 2017-09-07 NOTE — Progress Notes (Signed)
[facility] at [place] NAME: Dorea Duff    MR#:  [id number]  DATE OF BIRTH:  14-May-1951  SUBJECTIVE: Seen at bedside, admitted for sepsis bilateral pneumonia.  Patient states that she feels still short of breath.  Chest x-ray yesterday showed bilateral pneumonia, mild pulmonary edema/started on small dose Lasix and we stopped the fluid.  Patient is cachectic.  started on Xanax yesterday for anxiety and she  Says it helped her.   CHIEF COMPLAINT:   Chief Complaint  Patient presents with  . Shortness of Breath  . Respiratory Distress    REVIEW OF SYSTEMS:   ROS CONSTITUTIONAL: Generalized weakness. EYES: No blurred or double vision.  EARS, NOSE, AND THROAT: No tinnitus or ear pain.  RESPIRATORY: Cough, shortness of breath,CARDIOVASCULAR: No chest pain, orthopnea, edema.  GASTROINTESTINAL: No nausea, vomiting, diarrhea or abdominal pain.  GENITOURINARY: No dysuria, hematuria.  ENDOCRINE: No polyuria, nocturia,  HEMATOLOGY: No anemia, easy bruising or bleeding SKIN: No rash or lesion. MUSCULOSKELETAL: No joint pain or arthritis.   NEUROLOGIC: No tingling, numbness, weakness.  PSYCHIATRY anxiety.Marland Kitchen   DRUG ALLERGIES:  No Known Allergies  VITALS:  Blood pressure (!) 119/57, pulse (!) 104, temperature 98.4 F (36.9 C), temperature source Oral, resp. rate 19, height 5\' 2"  (1.575 m), weight 39 kg (86 lb), SpO2 99 %.  PHYSICAL EXAMINATION:  GENERAL:  67 y.o.-year-old patient lying in the bed with no acute distress.  Patient is cachectic/ EYES: Pupils equal, round, reactive to light and accommodation. No scleral icterus. Extraocular muscles intact.  HEENT: Head atraumatic, normocephalic. Oropharynx and nasopharynx clear.  NECK:  Supple, no jugular venous distention. No thyroid enlargement, no tenderness.  LUNGS: No wheezing today but diminished breath sounds bilaterally. CARDIOVASCULAR: S1, S2 normal. No murmurs, rubs, or gallops.   ABDOMEN: Soft, nontender, nondistended. Bowel sounds present. No organomegaly or mass.  EXTREMITIES: No pedal edema, cyanosis, or clubbing.  NEUROLOGIC: Cranial nerves II through XII are intact. Muscle strength 5/5 in all extremities. Sensation intact. Gait not checked.  PSYCHIATRIC: The patient is alert and oriented x 3.  SKIN: No obvious rash, lesion, or ulcer.    LABORATORY PANEL:   CBC Recent Labs  Lab 09/06/17 1240  WBC 22.8*  HGB 9.3*  HCT 28.6*  PLT 373   ------------------------------------------------------------------------------------------------------------------  Chemistries  Recent Labs  Lab 09/04/17 2222  09/06/17 1240  NA 137   < > 140  K 4.0   < > 3.5  CL 102   < > 111  CO2 23   < > 22  GLUCOSE 110*   < > 90  BUN 16   < > 20  CREATININE 1.08*   < > 0.85  CALCIUM 8.8*   < > 7.8*  AST 67*  --   --   ALT 24  --   --   ALKPHOS 106  --   --   BILITOT 0.7  --   --    < > = values in this interval not displayed.   ------------------------------------------------------------------------------------------------------------------  Cardiac Enzymes Recent Labs  Lab 09/05/17 0323  TROPONINI 0.03*   ------------------------------------------------------------------------------------------------------------------  RADIOLOGY:  Dg Chest Port 1 View  Result Date: 09/06/2017 CLINICAL DATA:  Pt has respiratory failure secondary to bilateral pneumonia Hx/o COPD Former smoker EXAM: PORTABLE CHEST 1 VIEW COMPARISON:  09/04/2017 FINDINGS: Lungs are hyperinflated. Heart size is enlarged. There are increased interstitial markings consistent with edema. More confluent opacity at the lung bases consistent with  consolidation. There are small bilateral pleural effusions. IMPRESSION: 1. Hyperinflation. 2. Interstitial edema. 3. Bibasilar consolidations. Electronically Signed   By: Nolon Nations M.D.   On: 09/06/2017 14:34    EKG:   Orders placed or performed during the  hospital encounter of 09/04/17  . ED EKG  . ED EKG  . EKG 12-Lead  . EKG 12-Lead  . EKG 12-Lead  . EKG 12-Lead    ASSESSMENT AND PLAN:   Respiratory failure secondary to bilateral pneumonia: Continue IV antibiotics, oxygen, bronchodilators.  Patient is down to 2 L of oxygen and saturation 99%  But she  Still e feels like she is short of breath.  Anxiety may be playing a role so started on Xanax.  2.  Sepsis present on admission with pneumonia: Continue IV antibiotics, follow sputum cultures, white count.  Lactic acid level is coming down from 3.2 to 2.6.  WBC also is down.  And patient has no temperature.  Blood cultures have been negative.  Sputum culture showed gram-positive cocci, positive rods, waiting for final results.   3.  COPD exacerbation: Continue IV steroids, bronchodilators, oxygen.  Check ambulatory oxygen levels before patient goes home.   4.  Hypokalemia: Replace the potassium. 5.  Acute kidney injury due to acute renal failure from sepsis: Due to, discontinue IV fluids, p.o. intake. 6.  Urine retention: Foley catheter, plan to discontinue Foley today, start voiding trials.  Discussed this with patient.  7.  Deconditioning, generalized weakness secondary to poor nutritional status: Physical therapy consult, dietitian consult, case management evaluation to assess home situation.   Discussed with nurse,  Patient  All the records are reviewed and case discussed with Care Management/Social Workerr. Management plans discussed with the patient, family and they are in agreement.  CODE STATUS: Full code  TOTAL TIME TAKING CARE OF THIS PATIENT: 36minutes.   POSSIBLE D/C IN 1-2 DAYS, DEPENDING ON CLINICAL CONDITION.   Epifanio Lesches M.D on 09/07/2017 at 11:59 AM  Between 7am to 6pm - Pager - [phone]  After 6pm go to [email or web address] - password [email or web address] [facility]  Office  [phone]  CC: Primary care physician; Letta Median,  MD   Note: This dictation was prepared with Dragon dictation along with smaller phrase technology. Any transcriptional errors that result from this process are unintentional.

## 2017-09-07 NOTE — Progress Notes (Signed)
Initial Nutrition Assessment  DOCUMENTATION CODES:   Severe malnutrition in context of chronic illness  INTERVENTION:   Ensure Enlive po TID, each supplement provides 350 kcal and 20 grams of protein  Magic cup TID with meals, each supplement provides 290 kcal and 9 grams of protein  MVI daily  Liberalize diet   NUTRITION DIAGNOSIS:   Severe Malnutrition related to catabolic illness(COPD) as evidenced by severe fat depletion, severe muscle depletion.  GOAL:   Patient will meet greater than or equal to 90% of their needs  MONITOR:   PO intake, Supplement acceptance, Weight trends, Labs, I & O's  REASON FOR ASSESSMENT:   Consult Assessment of nutrition requirement/status  ASSESSMENT:    67 y.o. female with a known history of COPD, not on home oxygen admitted for pneumonia    Met with pt in room today. Pt reports poor appetite and oral intake pta r/t difficulty breathing. Pt reports that she is just "too out of breath to eat sometimes". Pt does not drink supplements at home. Pt reports that her weight is stable. RD discussed with pt the importance of adequate protein needed to preserve lean muscle. Pt is willing to try supplements; RD will order. Pt ate 100% of her breakfast today that included toast. Will add supplements and liberalize diet.   Medications reviewed and include: colace, heparin, solu-medrol, protonix, azithromycin, ceftriaxone  Labs reviewed:   Nutrition-Focused physical exam completed. Findings are severe fat and muscle depletions over entire body, and no edema.   Diet Order:  Diet regular Room service appropriate? Yes; Fluid consistency: Thin  EDUCATION NEEDS:   Education needs have been addressed  Skin:  Reviewed RN Assessment  Last BM:  PTA  Height:   Ht Readings from Last 1 Encounters:  09/05/17 _0  (1.575 m)    Weight:   Wt Readings from Last 1 Encounters:  09/07/17 86 lb (39 kg)    Ideal Body Weight:  50 kg  BMI:  Body mass  index is 15.73 kg/m.  Estimated Nutritional Needs:   Kcal:  1200-1400kcal/day   Protein:  59-66g/day   Fluid:  >1L/day   Koleen Distance MS, RD, LDN Pager #[phone] After Hours Pager: [phone]

## 2017-09-08 ENCOUNTER — Inpatient Hospital Stay: Payer: Medicare Other

## 2017-09-08 ENCOUNTER — Encounter: Payer: Self-pay | Admitting: Radiology

## 2017-09-08 DIAGNOSIS — E43 Unspecified severe protein-calorie malnutrition: Secondary | ICD-10-CM

## 2017-09-08 DIAGNOSIS — R339 Retention of urine, unspecified: Secondary | ICD-10-CM | POA: Diagnosis not present

## 2017-09-08 DIAGNOSIS — R310 Gross hematuria: Secondary | ICD-10-CM | POA: Diagnosis not present

## 2017-09-08 LAB — CBC
HEMATOCRIT: 32 % — AB (ref 35.0–47.0)
HEMOGLOBIN: 10.4 g/dL — AB (ref 12.0–16.0)
MCH: 28.3 pg (ref 26.0–34.0)
MCHC: 32.5 g/dL (ref 32.0–36.0)
MCV: 87 fL (ref 80.0–100.0)
PLATELETS: 421 10*3/uL (ref 150–440)
RBC: 3.68 MIL/uL — AB (ref 3.80–5.20)
RDW: 14.5 % (ref 11.5–14.5)
WBC: 11.9 10*3/uL — AB (ref 3.6–11.0)

## 2017-09-08 LAB — BASIC METABOLIC PANEL
ANION GAP: 8 (ref 5–15)
BUN: 15 mg/dL (ref 6–20)
CHLORIDE: 107 mmol/L (ref 101–111)
CO2: 26 mmol/L (ref 22–32)
Calcium: 8 mg/dL — ABNORMAL LOW (ref 8.9–10.3)
Creatinine, Ser: 0.85 mg/dL (ref 0.44–1.00)
GFR calc Af Amer: >60 mL/min (ref >60)
GLUCOSE: 107 mg/dL — AB (ref 65–99)
POTASSIUM: 3.3 mmol/L — AB (ref 3.5–5.1)
Sodium: 141 mmol/L (ref 135–145)

## 2017-09-08 LAB — GLUCOSE, CAPILLARY: Glucose-Capillary: 85 mg/dL (ref 65–99)

## 2017-09-08 MED ORDER — IOPAMIDOL (ISOVUE-300) INJECTION 61%
100.0000 mL | Freq: Once | INTRAVENOUS | Status: AC | PRN
  Administered 2017-09-08: 100 mL via INTRAVENOUS

## 2017-09-08 MED ORDER — SODIUM CHLORIDE 0.9% FLUSH
3.0000 mL | Freq: Two times a day (BID) | INTRAVENOUS | Status: DC
  Administered 2017-09-08 – 2017-09-13 (×11): 3 mL via INTRAVENOUS

## 2017-09-08 MED ORDER — SODIUM CHLORIDE 0.9 % IV SOLN
INTRAVENOUS | Status: DC
  Administered 2017-09-08: 22:00:00 via INTRAVENOUS

## 2017-09-08 MED ORDER — IOPAMIDOL (ISOVUE-300) INJECTION 61%
15.0000 mL | INTRAVENOUS | Status: AC
  Administered 2017-09-08 (×2): 15 mL via ORAL

## 2017-09-08 NOTE — Progress Notes (Addendum)
Patient stating her bladder is hurting now and she still cannot void. Bladder scan >685. (Unable to cath last night, see previous RN note). Dr. Erlene Quan paged - waiting call back.  **Update 1030 - foley catheter placed per verbal order by Dr. Erlene Quan. MD to round this afternoon. Patient tolerated well and stated immediate relief.

## 2017-09-08 NOTE — Plan of Care (Signed)
  Progressing Clinical Measurements: Diagnostic test results will improve 09/08/2017 0000 - Progressing by Loran Senters, RN Signs and symptoms of infection will decrease 09/08/2017 0000 - Progressing by Loran Senters, RN Education: Knowledge of General Education information will improve 09/08/2017 0000 - Progressing by Loran Senters, RN Safety: Ability to remain free from injury will improve 09/08/2017 0000 - Progressing by Loran Senters, RN

## 2017-09-08 NOTE — Progress Notes (Signed)
Dr. Vianne Bulls aware of potassium. No new orders at this time. MD to review.

## 2017-09-08 NOTE — Care Management Note (Addendum)
Case Management Note  Patient Details  Name: Stacy Zhang MRN: [id number] Date of Birth: 1950-12-21  Subjective/Objective:                 Patient admitted from home with respiratory distress due to bilateral pneumonia and exac of copd.  She was living with her boyfriend "but I do not know where he is.  He went coocoo and left."  Is followed by Dr Rebeca Alert at [facility].  Patient says she has medicare and medicaid.  Uses a cab for transportation.  She did use the [employer] [place] but started having a lot of pain in her right hip and since October has became unable to climb the steps to get on the bus. she would be in agreement to have home health services if it was needed.  At present, she does not know who she would call to transport her home.  Discussed medicaid transportation and dial a ride. Does not have ambulation devices at home.  PT consult is pending  Action/Plan:  assessment for home 02  Expected Discharge Date:                  Expected Discharge Plan:     In-House Referral:     Discharge planning Services     Post Acute Care Choice:    Choice offered to:     DME Arranged:    DME Agency:     HH Arranged:    HH Agency:     Status of Service:     If discussed at [employer], dates discussed:    Additional Comments:  Katrina Stack, RN 09/08/2017, 2:48 PM

## 2017-09-08 NOTE — Consult Note (Signed)
Urology Consult  I have been asked to see the patient by Dr. Vianne Bulls, for evaluation and management of urinary retention/ gross hematuria.  Chief Complaint: Difficulty voiding  History of Present Illness: DAIANNA Zhang is a 67 y.o. year old admitted to the medical service with sepsis related to pneumonia with underlying COPD.    During this admission, she had a Foley catheter which was placed.  This was removed yesterday.  Overnight, she had difficulty voiding and attempted I&O catheter was attempted but unsuccessful.  Upon placement of the catheter, there was return of blood.  She continued to have difficulty urinating throughout the morning and bladder scan in the mid morning showed greater than 585 cc.  At this point time, the patient was becoming uncomfortable with lower abdominal pain and distention and unable to void.  The daytime nurse was able to place the catheter without any difficulty with clear return of yellow urine.  Her pain resolved shortly after catheter replacement.  She does report occasional difficulty emptying her bladder at home.  She is never had an episode of frank urinary retention.  She denies any vaginal bulging.    She is quite deconditioned and not ambulating.  She also reports today that she has occasionally seen gross blood both in her urine and possibly from her vagina on various occasions at home.  This is not associated with any other urinary symptoms.  She does have a personal history of smoking, smoked as much as 2 packs a day quit 30 years ago.    Past Medical History:  Diagnosis Date  . COPD (chronic obstructive pulmonary disease) ([place])     History reviewed. No pertinent surgical history.  Home Medications:  Current Meds  Medication Sig  . COMBIVENT RESPIMAT 20-100 MCG/ACT AERS respimat Inhale 2 puffs into the lungs 2 (two) times daily.  . fluticasone (FLONASE) 50 MCG/ACT nasal spray Place 1 spray into both nostrils daily.    Allergies:  No Known Allergies  History reviewed. No pertinent family history.  Social History:  reports that she has quit smoking. she has never used smokeless tobacco. She reports that she does not drink alcohol. Her drug history is not on file.  ROS: A complete review of systems was performed.  + deconditioning, SOB, weakness.    Physical Exam:  Vital signs in last 24 hours: Temp:  [98.2 F (36.8 C)-98.7 F (37.1 C)] 98.7 F (37.1 C) (02/05 1504) Pulse Rate:  [108-117] 115 (02/05 1504) Resp:  [18-20] 20 (02/05 1504) BP: (121-145)/(52-83) 126/64 (02/05 1504) SpO2:  [90 %-100 %] 97 % (02/05 1504) FiO2 (%):  [18 %] 18 % (02/04 2008) Weight:  [85 lb 14.4 oz (39 kg)] 85 lb 14.4 oz (39 kg) (02/05 0419) Constitutional:  Alert and oriented, No acute distress HEENT: [place] AT, moist mucus membranes.  Trachea midline, no masses Cardiovascular: Regular rate and rhythm, no clubbing, cyanosis, or edema. Respiratory: Normal respiratory effort, lungs clear bilaterally GI: Abdomen is soft, nontender, nondistended, no abdominal masses.  Thin.  C-section scar appreciated. GU: Foley catheter in place.  Normal external genitalia.  Pelvic exam somewhat limited due to patient positioning in hospital bed, blood was noted either per vagina or per urethral meatus.  Urine draining clear yellow urine. Skin: No rashes, bruises or suspicious lesions Neurologic: Grossly intact, no focal deficits, moving all 4 extremities Psychiatric: Normal mood and affect   Laboratory Data:  Recent Labs    09/06/17 1240 09/08/17 0413  WBC  22.8* 11.9*  HGB 9.3* 10.4*  HCT 28.6* 32.0*   Recent Labs    09/06/17 1240 09/08/17 0413  NA 140 141  K 3.5 3.3*  CL 111 107  CO2 22 26  GLUCOSE 90 107*  BUN 20 15  CREATININE 0.85 0.85  CALCIUM 7.8* 8.0*   No results for input(s): LABPT, INR in the last 72 hours. No results for input(s): LABURIN in the last 72 hours. Results for orders placed or performed during the hospital  encounter of 09/04/17  Blood culture (routine x 2)     Status: None (Preliminary result)   Collection Time: 09/04/17 11:54 PM  Result Value Ref Range Status   Specimen Description BLOOD LEFT ANTECUBITAL  Final   Special Requests   Final    BOTTLES DRAWN AEROBIC AND ANAEROBIC Blood Culture adequate volume   Culture   Final    NO GROWTH 3 DAYS Performed at [facility], [address], [place], [place] [address]    Report Status PENDING  Incomplete  Blood culture (routine x 2)     Status: None (Preliminary result)   Collection Time: 09/04/17 11:55 PM  Result Value Ref Range Status   Specimen Description BLOOD BLOOD LEFT ARM  Final   Special Requests   Final    BOTTLES DRAWN AEROBIC AND ANAEROBIC Blood Culture adequate volume   Culture   Final    NO GROWTH 3 DAYS Performed at [facility], [address] [place], [place] [address]    Report Status PENDING  Incomplete  Culture, expectorated sputum-assessment     Status: None   Collection Time: 09/06/17  7:52 PM  Result Value Ref Range Status   Specimen Description EXPECTORATED SPUTUM  Final   Special Requests Normal  Final   Sputum evaluation   Final    THIS SPECIMEN IS ACCEPTABLE FOR SPUTUM CULTURE Performed at [facility], [address] [place], [place] [address]    Report Status 09/06/2017 FINAL  Final  Culture, respiratory (NON-Expectorated)     Status: None (Preliminary result)   Collection Time: 09/06/17  7:52 PM  Result Value Ref Range Status   Specimen Description   Final    EXPECTORATED SPUTUM Performed at [facility], [address], [place], [place] [address]    Special Requests   Final    Normal Reflexed from [phone] Performed at [facility], [address] [place], [place] [address]    Gram Stain   Final    RARE WBC PRESENT, PREDOMINANTLY PMN RARE GRAM POSITIVE COCCI RARE GRAM POSITIVE RODS    Culture   Final    CULTURE REINCUBATED FOR BETTER  GROWTH Performed at [facility], [place] [address] [place], [place] [address]    Report Status PENDING  Incomplete     Radiologic Imaging: Dg Hip Unilat With Pelvis 2-3 Views Right  Result Date: 09/08/2017 CLINICAL DATA:  Right hip pain for several days, no known injury, initial encounter EXAM: DG HIP (WITH OR WITHOUT PELVIS) 2-3V RIGHT COMPARISON:  None. FINDINGS: There is a moth-eaten appearance involving the superior and inferior pubic ramus on the right. Discontinuity particularly in superior pubic ramus is noted likely related to pathologic fracture. No other definitive lytic lesions are seen. 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 CT of the abdomen and pelvis is recommended for further evaluation. These results will be called to the ordering clinician or  representative by the Radiologist Assistant, and communication documented in the PACS or zVision Dashboard. Electronically Signed   By: Inez Catalina M.D.   On: 09/08/2017 14:56    Impression/Assessment:  67 year old chronically ill female admitted with pneumonia/COPD exacerbation now with urinary retention.  Suspect blood at the time of attempted catheter replacement likely related to trauma.  That being said, she does report today history of intermittent gross hematuria versus vaginal bleeding.  Given her extensive smoking history in the past, I would recommend cystoscopy as outpatient to evaluate her bladder as well as pelvic exam.  Plan:  -Maintain Foley catheter for additional 2 days and then attempt voiding trial again -We will avoid starting Flomax in this patient due to concern for orthostatic hypotension and limited benefit from nonneurological causes of retention in females -If she fails a voiding trial, recommend replacement of Foley catheter with follow-up with urology in 1 week -Plan for outpatient cystoscopy 1 week after discharge -CT  scan ordered by hospitalist due to concern for abnormal hip lesion, will follow up the scan  09/08/2017, 4:54 PM  Hollice Espy,  MD

## 2017-09-08 NOTE — Progress Notes (Addendum)
Foley removed around 1210 per Richardson Landry, RN. Patient has not voided since foley removal. Bladder scan shows 327 mL. Patient does not appear uncomfortable, and reports that she feels fine. Patient encouraged to get on bedside commode. Patient very hesitant about getting on BSC. This RN will continue to encourage her to use bathroom. Will continue to monitor.  Update : Bladder scan shows 447 mL in bladder. Patient still does not report any uncomfortableness, however abdomen is slightly distended. This RN attempted to get patient to [facility] to attempt urination, but patient refuses. Reports " I dont think I will do anything, I do not feel like I have to use the bathroom." This RN educated the patient on the importance of attempting to use the bathroom on BSC, and why we try to avoid using catheter. Patient verbally acknowledges education but still refusing to use bathroom. MD Duane Boston notified. Per MD in and out cath once.   Update:Unable to fully advance catheter into bladder during I/O. Blood clots could be seen in the catheter. Procedure stopped at this time. MD Pyreddy notified. Per MD, hold any blood thinner medications and place urology consult for acute urinary retention. Will place orders and continue to monitor.   Iran Sizer M

## 2017-09-08 NOTE — Progress Notes (Signed)
PT Cancellation Note  Patient Details Name: Stacy Zhang MRN: [id number] DOB: April 22, 1951   Cancelled Treatment:    Reason Eval/Treat Not Completed: Other (comment).  Pt is unable to attempt to walk as she is taking in contrast liquid and feeling poorly.  Will try again in the AM to see if she feels up to walking.   Ramond Dial 09/08/2017, 4:35 PM   Mee Hives, PT MS Acute Rehab Dept. Number: [place] [facility] [place]

## 2017-09-08 NOTE — Plan of Care (Signed)
Encourage patient to increase activity and frequent meals. Patient may benefit from psychological review patient appears to be in denial about plan of care and disease progression.

## 2017-09-08 NOTE — Progress Notes (Addendum)
Abnormal CT results. Dr. Erlene Quan aware. Paged on-call hospitalist. Waiting for return call.   *[phone] - Dr. Leslye Peer returned page. Aware of abnormal results. No new orders. Information to be passed on to rounding physician tomorrow.

## 2017-09-08 NOTE — Progress Notes (Addendum)
Dr. Vianne Bulls called this RN stating abnormal hip xray and requesting RN to place order for CT Abdomen and CT chest with contrast as routine.

## 2017-09-08 NOTE — Progress Notes (Signed)
Per transport, when patient was rolled off bed in CT there was bright blood under her. Upon returning to room patient was inspected. No signs of bleeding from sacral area/hemorrhoids, and no blood in foley bag. Patient cleaned, pink tinge seems mostly towards vaginal area. Patient states that she does have some bleeding down there at home at times. Dr. Vianne Bulls notified. No new orders. Will continue to monitor. Waiting for CT Abd/chest results. Patient resting quietly in bed at this time. No complaints.

## 2017-09-08 NOTE — Progress Notes (Signed)
[place] [facility] [place] NAME: Stacy Zhang    MR#:  [id number]  DATE OF BIRTH:  1950/09/16  Seen at bedside, patient states that she feels better but complains of right hip pain, continue to have urine retention so Foley is reinserted back again.  Tried to remove the Foley yesterday but patient had urine retention again so Foley catheter reinserted again.  CHIEF COMPLAINT:   Chief Complaint  Patient presents with  . Shortness of Breath  . Respiratory Distress    REVIEW OF SYSTEMS:   Review of Systems  Respiratory: Positive for cough, sputum production and shortness of breath.    CONSTITUTIONAL: Generalized weakness. EYES: No blurred or double vision.  EARS, NOSE, AND THROAT: No tinnitus or ear pain.  RESPIRATORY: Cough, shortness of breath, CARDIOVASCULAR: No chest pain, orthopnea, edema.  GASTROINTESTINAL: No nausea, vomiting, diarrhea or abdominal pain.  GENITOURINARY: No dysuria, hematuria.  ENDOCRINE: No polyuria, nocturia,  HEMATOLOGY: No anemia, easy bruising or bleeding SKIN: No rash or lesion. MUSCULOSKELETAL: Right hip pain.  Patient states that she has been having right hip pain for long time but it is more when moving. NEUROLOGIC: No tingling, numbness, weakness.  PSYCHIATRY anxiety.Marland Kitchen   DRUG ALLERGIES:  No Known Allergies  VITALS:  Blood pressure 130/76, pulse (!) 111, temperature 98.2 F (36.8 C), temperature source Oral, resp. rate 19, height 5\' 2"  (1.575 m), weight 39 kg (85 lb 14.4 oz), SpO2 98 %.  PHYSICAL EXAMINATION:  GENERAL:  67 y.o.-year-old patient lying in the bed with no acute distress.  Patient is cachectic/ EYES: Pupils equal, round, reactive to light and accommodation. No scleral icterus. Extraocular muscles intact.  HEENT: Head atraumatic, normocephalic. Oropharynx and nasopharynx clear.  NECK:  Supple, no jugular venous distention. No thyroid enlargement, no tenderness.  LUNGS: No wheezing today  but diminished breath sounds bilaterally. CARDIOVASCULAR: S1, S2 normal. No murmurs, rubs, or gallops.  ABDOMEN: Soft, nontender, nondistended. Bowel sounds present. No organomegaly or mass.  EXTREMITIES: No pedal edema, cyanosis, or clubbing.  No tenderness in the right hip, range of motion is slightly limited because of the pain. NEUROLOGIC: Cranial nerves II through XII are intact. Muscle strength 5/5 in all extremities. Sensation intact. Gait not checked.  PSYCHIATRIC: The patient is alert and oriented x 3.  SKIN: No obvious rash, lesion, or ulcer.    LABORATORY PANEL:   CBC Recent Labs  Lab 09/08/17 0413  WBC 11.9*  HGB 10.4*  HCT 32.0*  PLT 421   ------------------------------------------------------------------------------------------------------------------  Chemistries  Recent Labs  Lab 09/04/17 2222  09/08/17 0413  NA 137   < > 141  K 4.0   < > 3.3*  CL 102   < > 107  CO2 23   < > 26  GLUCOSE 110*   < > 107*  BUN 16   < > 15  CREATININE 1.08*   < > 0.85  CALCIUM 8.8*   < > 8.0*  AST 67*  --   --   ALT 24  --   --   ALKPHOS 106  --   --   BILITOT 0.7  --   --    < > = values in this interval not displayed.   ------------------------------------------------------------------------------------------------------------------  Cardiac Enzymes Recent Labs  Lab 09/05/17 0323  TROPONINI 0.03*   ------------------------------------------------------------------------------------------------------------------  RADIOLOGY:  Dg Chest Port 1 View  Result Date: 09/06/2017 CLINICAL DATA:  Pt has respiratory failure secondary to bilateral pneumonia  Hx/o COPD Former smoker EXAM: PORTABLE CHEST 1 VIEW COMPARISON:  09/04/2017 FINDINGS: Lungs are hyperinflated. Heart size is enlarged. There are increased interstitial markings consistent with edema. More confluent opacity at the lung bases consistent with consolidation. There are small bilateral pleural effusions. IMPRESSION:  1. Hyperinflation. 2. Interstitial edema. 3. Bibasilar consolidations. Electronically Signed   By: Nolon Nations M.D.   On: 09/06/2017 14:34    EKG:   Orders placed or performed during the hospital encounter of 09/04/17  . ED EKG  . ED EKG  . EKG 12-Lead  . EKG 12-Lead  . EKG 12-Lead  . EKG 12-Lead    ASSESSMENT AND PLAN:   Respiratory failure secondary to bilateral pneumonia: Continue IV antibiotics, oxygen, bronchodilators.  Patient is down to 2 L of oxygen and saturation 99%  But she  Still e feels like she is short of breath.  Anxiety may be playing a role so started on Xanax.  2.  Sepsis present on admission with pneumonia: Continue IV antibiotics, follow sputum cultures, white count.  Lactic acid level is coming down from 3.2 to 2.6.  WBC also is down.  And patient has no temperature.  Blood cultures have been negative.  Sputum culture showed gram-positive cocci, positive rods, waiting for final results.    3.  COPD exacerbation: Continue IV steroids, bronchodilators, oxygen.  Check ambulatory oxygen levels before patient goes home.   4.  Hypokalemia: Replace the potassium.  5.  Acute kidney injury due to acute renal failure from sepsis: Due to, discontinue IV fluids, p.o. Intake.  6.  Urine retention: Continue Foley, urology consult pending,  7.  Deconditioning, generalized weakness secondary to poor nutritional status: Physical therapy consult, dietitian consult, case management evaluation to assess home situation.  #8 severe malnutrition in the context of chronic illness: Seen by dietitian, continue Ensure. 9.  Right hip pain and ambulatory difficulties: Check right hip x-ray to make sure there is no fracture.  Likely arthritis.  Discussed with nurse,  Patient  All the records are reviewed and case discussed with Care Management/Social Workerr. Management plans discussed with the patient, family and they are in agreement.  CODE STATUS: Full code  TOTAL TIME  TAKING CARE OF THIS PATIENT: 73minutes.   POSSIBLE D/C IN 1-2 DAYS, DEPENDING ON CLINICAL CONDITION.   Epifanio Lesches M.D on 09/08/2017 at 1:28 PM  Between 7am to 6pm - Pager - [phone]  After 6pm go to [email or web address] - password [email or web address] [facility]  Office  [phone]  CC: Primary care physician; Letta Median, MD   Note: This dictation was prepared with Dragon dictation along with smaller phrase technology. Any transcriptional errors that result from this process are unintentional.

## 2017-09-09 DIAGNOSIS — J449 Chronic obstructive pulmonary disease, unspecified: Secondary | ICD-10-CM

## 2017-09-09 DIAGNOSIS — J189 Pneumonia, unspecified organism: Secondary | ICD-10-CM | POA: Diagnosis not present

## 2017-09-09 DIAGNOSIS — C787 Secondary malignant neoplasm of liver and intrahepatic bile duct: Secondary | ICD-10-CM

## 2017-09-09 DIAGNOSIS — R339 Retention of urine, unspecified: Secondary | ICD-10-CM | POA: Diagnosis not present

## 2017-09-09 DIAGNOSIS — R0602 Shortness of breath: Secondary | ICD-10-CM | POA: Diagnosis not present

## 2017-09-09 DIAGNOSIS — N939 Abnormal uterine and vaginal bleeding, unspecified: Secondary | ICD-10-CM

## 2017-09-09 DIAGNOSIS — N1330 Unspecified hydronephrosis: Secondary | ICD-10-CM | POA: Diagnosis not present

## 2017-09-09 DIAGNOSIS — R634 Abnormal weight loss: Secondary | ICD-10-CM

## 2017-09-09 DIAGNOSIS — R310 Gross hematuria: Secondary | ICD-10-CM | POA: Diagnosis not present

## 2017-09-09 DIAGNOSIS — C538 Malignant neoplasm of overlapping sites of cervix uteri: Secondary | ICD-10-CM | POA: Diagnosis not present

## 2017-09-09 DIAGNOSIS — C7951 Secondary malignant neoplasm of bone: Secondary | ICD-10-CM

## 2017-09-09 LAB — CULTURE, RESPIRATORY: CULTURE: NORMAL

## 2017-09-09 LAB — GLUCOSE, CAPILLARY
GLUCOSE-CAPILLARY: 90 mg/dL (ref 65–99)
Glucose-Capillary: 108 mg/dL — ABNORMAL HIGH (ref 65–99)

## 2017-09-09 LAB — CULTURE, RESPIRATORY W GRAM STAIN: Special Requests: NORMAL

## 2017-09-09 MED ORDER — AZITHROMYCIN 250 MG PO TABS
500.0000 mg | ORAL_TABLET | Freq: Every day | ORAL | Status: AC
  Administered 2017-09-09 – 2017-09-11 (×3): 500 mg via ORAL
  Filled 2017-09-09 (×3): qty 2

## 2017-09-09 NOTE — Plan of Care (Signed)
Encourage patient to express emotions about current prognosis and healthcare goals.

## 2017-09-09 NOTE — Progress Notes (Signed)
PHARMACIST - PHYSICIAN COMMUNICATION DR:   Quentin Cornwall CONCERNING: Antibiotic IV to Oral Route Change Policy  RECOMMENDATION: This patient is receiving Azithromycin by the intravenous route.  Based on criteria approved by the Pharmacy and Therapeutics Committee, the antibiotic(s) is/are being converted to the equivalent oral dose form(s).   DESCRIPTION: These criteria include:  Patient being treated for a respiratory tract infection, urinary tract infection, cellulitis or clostridium difficile associated diarrhea if on metronidazole  The patient is not neutropenic and does not exhibit a GI malabsorption state  The patient is eating (either orally or via tube) and/or has been taking other orally administered medications for a least 24 hours  The patient is improving clinically and has a Tmax < 100.5  If you have questions about this conversion, please contact the Pharmacy Department  []   [phone] )  Stacy Zhang [x]   [phone] )  [facility] []   [phone] )  Stacy Zhang []   [phone] )  [facility] []   [phone] )  Stacy Zhang  Larene Beach, PharmD

## 2017-09-09 NOTE — Progress Notes (Signed)
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  I told the patient about this, she is very tearful to Lauren that she has cancer I gave her time to assimilate the information.  CT abdomen results discussed with Dr. Erlene Quan and also I spoke with Dr. Leafy Ro from OB/GYN, she will talk to gynecological oncology today and see if patient needs to go to [facility] for the further treatment of her cervical cancer.  At this time I am still waiting to get a call back from GYN, discussed and also patient.  CHIEF COMPLAINT:   Chief Complaint  Patient presents with  . Shortness of Breath  . Respiratory Distress    REVIEW OF SYSTEMS:   Review of Systems  Respiratory: Positive for cough, sputum production and shortness of breath.    CONSTITUTIONAL: Generalized weakness.,  Very cachectic, tearful here the news abou abnormal CT of abdomen with possible cervical cancer. EYES: No blurred or double vision.  EARS, NOSE, AND THROAT: No tinnitus or ear pain.  RESPIRATORY: Cough, shortness of breath, ulcers in the mouth. CARDIOVASCULAR: No chest pain, orthopnea, edema.  GASTROINTESTINAL: No nausea, vomiting, diarrhea or abdominal pain.  GENITOURINARY: No dysuria, hematuria.  ENDOCRINE: No polyuria, nocturia,  HEMATOLOGY: No anemia, easy bruising or bleeding SKIN: No rash or lesion. MUSCULOSKELETAL: Right hip pain.  Patient states that she has been having right hip pain for long time but it is more when moving. NEUROLOGIC: No tingling, numbness, weakness.  PSYCHIATRY anxiety.Marland Kitchen   DRUG ALLERGIES:  No Known Allergies  VITALS:  Blood pressure 128/75, pulse (!) 110, temperature 98.3 F (36.8 C), temperature source Oral, resp. rate 19, height 5\' 2"  (1.575 m), weight 38.8 kg (85 lb 8 oz), SpO2 96  %.  PHYSICAL EXAMINATION:  GENERAL:  67 y.o.-year-old patient lying in the bed with no acute distress.  Patient is cachectic/ EYES: Pupils equal, round, reactive to light and accommodation. No scleral icterus. Extraocular muscles intact.  HEENT: Head atraumatic, normocephalic. Oropharynx and nasopharynx clear.  NECK:  Supple, no jugular venous distention. No thyroid enlargement, no tenderness.  LUNGS: No wheezing today but diminished breath sounds bilaterally. CARDIOVASCULAR: S1, S2 normal. No murmurs, rubs, or gallops.  ABDOMEN: Soft, nontender, nondistended. Bowel sounds present. No organomegaly or mass.  EXTREMITIES: No pedal edema, cyanosis, or clubbing.  No tenderness in the right hip, range of motion is slightly limited because of the pain. NEUROLOGIC: Cranial nerves II through XII are intact. Muscle strength 5/5 in all extremities. Sensation intact. Gait not checked.  PSYCHIATRIC: The patient is alert and oriented x 3.  SKIN: No obvious rash, lesion, or ulcer.    LABORATORY PANEL:   CBC Recent Labs  Lab 09/08/17 0413  WBC 11.9*  HGB 10.4*  HCT 32.0*  PLT 421   ------------------------------------------------------------------------------------------------------------------  Chemistries  Recent Labs  Lab 09/04/17 2222  09/08/17 0413  NA 137   < > 141  K 4.0   < > 3.3*  CL 102   < > 107  CO2 23   < > 26  GLUCOSE 110*   < > 107*  BUN 16   < > 15  CREATININE 1.08*   < > 0.85  CALCIUM 8.8*   < > 8.0*  AST 67*  --   --  ALT 24  --   --   ALKPHOS 106  --   --   BILITOT 0.7  --   --    < > = values in this interval not displayed.   ------------------------------------------------------------------------------------------------------------------  Cardiac Enzymes Recent Labs  Lab 09/05/17 0323  TROPONINI 0.03*   ------------------------------------------------------------------------------------------------------------------  RADIOLOGY:  Ct Chest W  Contrast  Result Date: 09/08/2017 CLINICAL DATA:  Palpable non pulsatile abdominal mass, weight loss, nonlocalized abdominal pain, history COPD, former smoker EXAM: CT CHEST, ABDOMEN, AND PELVIS WITH CONTRAST TECHNIQUE: Multidetector CT imaging of the chest, abdomen and pelvis was performed following the standard protocol during bolus administration of intravenous contrast. Sagittal and coronal MPR images reconstructed from axial data set. CONTRAST:  156mL ISOVUE-300 IOPAMIDOL (ISOVUE-300) INJECTION 61% IV. Dilute oral contrast. COMPARISON:  CT chest 09/05/2017, CT abdomen and pelvis 10/22/2011 FINDINGS: CT CHEST FINDINGS Cardiovascular: Atherosclerotic calcifications aorta, proximal great vessels and coronary arteries. Aorta normal caliber. Pulmonary arteries grossly patent on nondedicated exam. No pericardial effusion. Mediastinum/Nodes: Esophagus unremarkable. Base of cervical region normal appearance. Normal sized mediastinal lymph nodes without thoracic adenopathy. Lungs/Pleura: Severe emphysematous changes consistent with COPD. Central peribronchial thickening. BILATERAL small pleural effusions and compressive atelectasis of the lower lobes, cannot exclude coexistent consolidation in the lower lobes. Remaining lungs clear. No pneumothorax. 5 mm RIGHT lung nodule image 47 unchanged. Additional tiny nodule anteromedial RIGHT upper lobe image 80 adjacent to anterior junction line. Musculoskeletal: No acute osseous findings CT ABDOMEN PELVIS FINDINGS Hepatobiliary: Numerous slightly low-attenuation masses throughout the liver compatible with widespread hepatic metastatic disease. This includes a 4.8 x 3.6 cm lesion at the lateral inferior RIGHT lobe image 72 as well as a central lesion in the medial segment LEFT lobe 4.7 x 2.7 cm image 62. Gallbladder unremarkable. Pancreas: Normal appearance Spleen: Normal appearance Adrenals/Urinary Tract: Adrenal glands normal appearance. 4 mm nonobstructing calculus at upper  pole RIGHT kidney. Marked hydronephrosis of LEFT kidney with cortical thinning and impaired nephrogram. Markedly dilated LEFT ureter 18 mm diameter extending into pelvis with high-grade obstruction of the distal LEFT ureter see below. Foley catheter decompresses urinary bladder. Stomach/Bowel: Increased stool in proximal colon. Appendix not definitely localized. Stomach and small bowel loops normal appearance. Vascular/Lymphatic: Atherosclerotic calcifications aorta and iliac arteries. Aorta normal caliber. Mildly enlarged LEFT inguinal and LEFT pelvic lymph nodes. Reproductive: Large central fluid collection within distended uterus with marked thinning of myometrium. Collection measures 7.6 x 6.2 x 5.4 cm. This likely represents hydrometrocolpos, secondary to a large macrolobulated irregular soft tissue mass at the lower uterine segment and cervix either representing a cervical or endometrial neoplasm. Mass measures approximately 7.7 x 6.1 x 4.6 cm, appears to involve the posterior wall of the bladder, uterus, and distal LEFT ureter, at superior vagina. Scattered infiltrative changes of surrounding pelvic fat planes. Additional questionable volar nodule 13 mm diameter midline inferior to the pubic symphysis image 111. Other: Small amount of free fluid in pelvis. No free air. No hernia. Musculoskeletal: Large destructive bone lesion involving the anterior column and medial wall of the RIGHT acetabulum extending into RIGHT superior and inferior pubic rami as well as pubic body consistent with osseous metastasis. IMPRESSION: Large macrolobulated irregular heterogeneous enhancing soft tissue mass centered at the cervix/lower uterine segment measuring 7.6 x 6.2 x 5.4 cm in size most favoring a cervical neoplasm though endometrial neoplasm and vaginal neoplasm are not excluded. Direct extension of tumor to involve the vagina and posterior bladder with obstruction of the the LEFT ureter and marked  LEFT  hydroureteronephrosis and impaired LEFT renal function, as well as cervical obstruction with hydrometrocolpos. Multiple hepatic metastases. Extensive osseous metastatic lesion involving the anterior column and medial wall of the RIGHT acetabulum as well as the RIGHT pubic body and RIGHT superior/inferior pubic rami. Pelvic adenopathy. BILATERAL pleural effusions with compressive atelectasis and question minimal infiltrate in lower lobes. Aortic Atherosclerosis (ICD10-I70.0) and Emphysema (ICD10-J43.9). Electronically Signed   By: Lavonia Dana M.D.   On: 09/08/2017 18:09   Ct Abdomen Pelvis W Contrast  Result Date: 09/08/2017 CLINICAL DATA:  Palpable non pulsatile abdominal mass, weight loss, nonlocalized abdominal pain, history COPD, former smoker EXAM: CT CHEST, ABDOMEN, AND PELVIS WITH CONTRAST TECHNIQUE: Multidetector CT imaging of the chest, abdomen and pelvis was performed following the standard protocol during bolus administration of intravenous contrast. Sagittal and coronal MPR images reconstructed from axial data set. CONTRAST:  173mL ISOVUE-300 IOPAMIDOL (ISOVUE-300) INJECTION 61% IV. Dilute oral contrast. COMPARISON:  CT chest 09/05/2017, CT abdomen and pelvis 10/22/2011 FINDINGS: CT CHEST FINDINGS Cardiovascular: Atherosclerotic calcifications aorta, proximal great vessels and coronary arteries. Aorta normal caliber. Pulmonary arteries grossly patent on nondedicated exam. No pericardial effusion. Mediastinum/Nodes: Esophagus unremarkable. Base of cervical region normal appearance. Normal sized mediastinal lymph nodes without thoracic adenopathy. Lungs/Pleura: Severe emphysematous changes consistent with COPD. Central peribronchial thickening. BILATERAL small pleural effusions and compressive atelectasis of the lower lobes, cannot exclude coexistent consolidation in the lower lobes. Remaining lungs clear. No pneumothorax. 5 mm RIGHT lung nodule image 47 unchanged. Additional tiny nodule anteromedial  RIGHT upper lobe image 80 adjacent to anterior junction line. Musculoskeletal: No acute osseous findings CT ABDOMEN PELVIS FINDINGS Hepatobiliary: Numerous slightly low-attenuation masses throughout the liver compatible with widespread hepatic metastatic disease. This includes a 4.8 x 3.6 cm lesion at the lateral inferior RIGHT lobe image 72 as well as a central lesion in the medial segment LEFT lobe 4.7 x 2.7 cm image 62. Gallbladder unremarkable. Pancreas: Normal appearance Spleen: Normal appearance Adrenals/Urinary Tract: Adrenal glands normal appearance. 4 mm nonobstructing calculus at upper pole RIGHT kidney. Marked hydronephrosis of LEFT kidney with cortical thinning and impaired nephrogram. Markedly dilated LEFT ureter 18 mm diameter extending into pelvis with high-grade obstruction of the distal LEFT ureter see below. Foley catheter decompresses urinary bladder. Stomach/Bowel: Increased stool in proximal colon. Appendix not definitely localized. Stomach and small bowel loops normal appearance. Vascular/Lymphatic: Atherosclerotic calcifications aorta and iliac arteries. Aorta normal caliber. Mildly enlarged LEFT inguinal and LEFT pelvic lymph nodes. Reproductive: Large central fluid collection within distended uterus with marked thinning of myometrium. Collection measures 7.6 x 6.2 x 5.4 cm. This likely represents hydrometrocolpos, secondary to a large macrolobulated irregular soft tissue mass at the lower uterine segment and cervix either representing a cervical or endometrial neoplasm. Mass measures approximately 7.7 x 6.1 x 4.6 cm, appears to involve the posterior wall of the bladder, uterus, and distal LEFT ureter, at superior vagina. Scattered infiltrative changes of surrounding pelvic fat planes. Additional questionable volar nodule 13 mm diameter midline inferior to the pubic symphysis image 111. Other: Small amount of free fluid in pelvis. No free air. No hernia. Musculoskeletal: Large destructive  bone lesion involving the anterior column and medial wall of the RIGHT acetabulum extending into RIGHT superior and inferior pubic rami as well as pubic body consistent with osseous metastasis. IMPRESSION: Large macrolobulated irregular heterogeneous enhancing soft tissue mass centered at the cervix/lower uterine segment measuring 7.6 x 6.2 x 5.4 cm in size most favoring a cervical neoplasm though endometrial neoplasm and  vaginal neoplasm are not excluded. Direct extension of tumor to involve the vagina and posterior bladder with obstruction of the the LEFT ureter and marked LEFT hydroureteronephrosis and impaired LEFT renal function, as well as cervical obstruction with hydrometrocolpos. Multiple hepatic metastases. Extensive osseous metastatic lesion involving the anterior column and medial wall of the RIGHT acetabulum as well as the RIGHT pubic body and RIGHT superior/inferior pubic rami. Pelvic adenopathy. BILATERAL pleural effusions with compressive atelectasis and question minimal infiltrate in lower lobes. Aortic Atherosclerosis (ICD10-I70.0) and Emphysema (ICD10-J43.9). Electronically Signed   By: Lavonia Dana M.D.   On: 09/08/2017 18:09   Dg Hip Unilat With Pelvis 2-3 Views Right  Result Date: 09/08/2017 CLINICAL DATA:  Right hip pain for several days, no known injury, initial encounter EXAM: DG HIP (WITH OR WITHOUT PELVIS) 2-3V RIGHT COMPARISON:  None. FINDINGS: There is a moth-eaten appearance involving the superior and inferior pubic ramus on the right. Discontinuity particularly in superior pubic ramus is noted likely related to pathologic fracture. No other definitive lytic lesions are seen. 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 CT of the abdomen and pelvis is recommended for further evaluation. These results will be called to the ordering clinician or representative by the Radiologist Assistant, and communication  documented in the PACS or zVision Dashboard. Electronically Signed   By: Inez Catalina M.D.   On: 09/08/2017 14:56    EKG:   Orders placed or performed during the hospital encounter of 09/04/17  . ED EKG  . ED EKG  . EKG 12-Lead  . EKG 12-Lead  . EKG 12-Lead  . EKG 12-Lead    ASSESSMENT AND PLAN:   Respiratory failure secondary to bilateral pneumonia: Continue IV antibiotics, oxygen, bronchodilators.  Patient is down to 2 L of oxygen and saturation 99%  But she  Still e feels like she is short of breath.  Anxiety may be playing a role so started on Xanax.  CT chest did not show any, cancer.  2.  Sepsis present on admission with pneumonia: Continue IV antibiotics, follow sputum cultures, white count.  Lactic acid level is coming down from 3.2 to 2.6.  WBC also is down.  And patient has no temperature.  Blood cultures have been negative.  Sputum culture showed gram-positive cocci, positive rods,   3.  COPD exacerbation: Continue IV steroids, bronchodilators,  4.  Hypokalemia: Replace the potassium.  5.  Acute kidney injury due to acute renal failure from sepsis: Due to, discontinue IV fluids, p.o. Intake.  #6. 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  Patient is aware of abnormal CT results, discussed with urology, OB/GYN, oncology consult also placed.  Patient also has multiple hepatic metastases.  Patient is very tearful and asking if she is going to die.  Get a palliative care consult also after workup from oncology, GYN to learn about her prognosis and treatment options.  Discussed with nurse,  Patient  All the records are reviewed and case discussed with Care Management/Social Workerr. Management plans discussed with the patient, family and they are in agreement.  CODE STATUS: Full code, discussed the CODE STATUS with her again and she wanted to be full code.  TOTAL TIME TAKING  CARE OF THIS PATIENT: 65minutes.   POSSIBLE D/C IN 1-2 DAYS, DEPENDING ON CLINICAL CONDITION.   Epifanio Lesches M.D on 09/09/2017 at 10:02  AM  Between 7am to 6pm - Pager - [phone]  After 6pm go to [email or web address] - password [email or web address] [facility]  Office  [phone]  CC: Primary care physician; Letta Median, MD   Note: This dictation was prepared with Dragon dictation along with smaller phrase technology. Any transcriptional errors that result from this process are unintentional.

## 2017-09-09 NOTE — Progress Notes (Signed)
PT Cancellation Note  Patient Details Name: TYJAH HAI MRN: [id number] DOB: 1951/01/01   Cancelled Treatment:     Pt stated that she would like to eat prior to doing a PT evaluation and that she might participate after that.  PT will check back if time allows.   Roxanne Gates, PT, DPT 09/09/2017, 10:02 AM

## 2017-09-09 NOTE — Progress Notes (Signed)
Patient ID: Stacy Zhang, female   DOB: 09-Jan-1951, 67 y.o.   MRN: [id number] Consult report dictated; Pt seen today and underwent cx biopsies by me and the specimen  was hand delivered to Dr Dicie Beam , pathologist  [facility].  Upper vaginal mass most likely cervical cancer with metastasis. Dr Cecille Po,  [facility] , is aware and would like to see the patient at [facility] cancer center Wednesday Feb 13th . Please arrange for that consultation .    Thankyou  Gwen Her Akisha Sturgill MD [facility]

## 2017-09-09 NOTE — Progress Notes (Signed)
Pharmacy Antibiotic Note  Stacy Zhang is a 67 y.o. female admitted on 09/04/2017 with sepsis s/t CAP pneumonia.  Pharmacy has been consulted for azithromycin/ceftriaxone dosing.  Plan: Will continue CTX 1 g IV and Azithromycin 500 mg daily.  Height: 5\' 2"  (157.5 cm) Weight: 85 lb 8 oz (38.8 kg) IBW/kg (Calculated) : 50.1  Temp (24hrs), Avg:98.4 F (36.9 C), Min:98.2 F (36.8 C), Max:98.7 F (37.1 C)  Recent Labs  Lab 09/04/17 2222 09/04/17 2355 09/05/17 0323 09/05/17 0721 09/06/17 1240 09/08/17 0413  WBC 37.5*  --  28.1*  --  22.8* 11.9*  CREATININE 1.08*  --  1.10*  --  0.85 0.85  LATICACIDVEN  --  2.2* 3.2* 2.6*  --   --     Estimated Creatinine Clearance: 39.9 mL/min (by C-G formula based on SCr of 0.85 mg/dL).    No Known Allergies  Thank you for allowing pharmacy to be a part of this patient's care.  Larene Beach, PharmD  Clinical Pharmacist 09/09/2017

## 2017-09-09 NOTE — Progress Notes (Signed)
Urology Consult Follow Up  Subjective: Underwent CT chest abdomen pelvis yesterday evening demonstrating a large heterogeneous enhancing pelvic mass involving the lower cervix/uterine segment measuring 7 x 6 x 6 0.2 x 5.4 which may have direct invasion into the posterior bladder wall with involvement of the left distal ureter.  There is severe left hydroureteronephrosis down to this level and significant cortical thinning/atrophy.  She also has widespread metastatic disease involving right hip and pelvic bones, liver, and significant adenopathy.  Foley continues to drain clear yellow urine.  Denies any flank pain.  Anti-infectives: Anti-infectives (From admission, onward)   Start     Dose/Rate Route Frequency Ordered Stop   09/09/17 2100  azithromycin (ZITHROMAX) tablet 500 mg     500 mg Oral Daily 09/09/17 1057     09/05/17 2200  azithromycin (ZITHROMAX) 500 mg in dextrose 5 % 250 mL IVPB  Status:  Discontinued     500 mg 250 mL/hr over 60 Minutes Intravenous Every 24 hours 09/05/17 0106 09/09/17 1057   09/05/17 1000  cefTRIAXone (ROCEPHIN) 1 g in dextrose 5 % 50 mL IVPB     1 g 100 mL/hr over 30 Minutes Intravenous Every 24 hours 09/05/17 0106     09/04/17 2330  cefTRIAXone (ROCEPHIN) 1 g in dextrose 5 % 50 mL IVPB     1 g 100 mL/hr over 30 Minutes Intravenous  Once 09/04/17 2326 09/05/17 0107   09/04/17 2330  azithromycin (ZITHROMAX) 500 mg in dextrose 5 % 250 mL IVPB     500 mg 250 mL/hr over 60 Minutes Intravenous  Once 09/04/17 2326 09/05/17 0141      Current Facility-Administered Medications  Medication Dose Route Frequency Provider Last Rate Last Dose  . acetaminophen (TYLENOL) tablet 650 mg  650 mg Oral Q6H PRN Amelia Jo, MD       Or  . acetaminophen (TYLENOL) suppository 650 mg  650 mg Rectal Q6H PRN Amelia Jo, MD      . ALPRAZolam Duanne Moron) tablet 0.5 mg  0.5 mg Oral BID Epifanio Lesches, MD   0.5 mg at 09/09/17 2426  . azithromycin (ZITHROMAX) tablet 500 mg  500  mg Oral Daily Merlyn Lot, MD      . bisacodyl (DULCOLAX) EC tablet 5 mg  5 mg Oral Daily PRN Amelia Jo, MD      . cefTRIAXone (ROCEPHIN) 1 g in dextrose 5 % 50 mL IVPB  1 g Intravenous Q24H Merlyn Lot, MD   Stopped at 09/09/17 1109  . docusate sodium (COLACE) capsule 100 mg  100 mg Oral BID Amelia Jo, MD   100 mg at 09/09/17 8341  . feeding supplement (ENSURE ENLIVE) (ENSURE ENLIVE) liquid 237 mL  237 mL Oral TID BM Epifanio Lesches, MD   237 mL at 09/09/17 0925  . fluticasone (FLONASE) 50 MCG/ACT nasal spray 1 spray  1 spray Each Nare Daily Amelia Jo, MD   1 spray at 09/09/17 [phone]  . guaiFENesin (MUCINEX) 12 hr tablet 600 mg  600 mg Oral BID Epifanio Lesches, MD   600 mg at 09/09/17 2979  . HYDROcodone-acetaminophen (NORCO/VICODIN) 5-325 MG per tablet 1-2 tablet  1-2 tablet Oral Q4H PRN Amelia Jo, MD   1 tablet at 09/05/17 1444  . Influenza vac split quadrivalent PF (FLUZONE HIGH-DOSE) injection 0.5 mL  0.5 mL Intramuscular Tomorrow-1000 Amelia Jo, MD   Stopped at 09/06/17 [phone]  . ipratropium-albuterol (DUONEB) 0.5-2.5 (3) MG/3ML nebulizer solution 3 mL  3 mL Nebulization Q6H Amelia Jo, MD  3 mL at 09/09/17 0718  . methylPREDNISolone sodium succinate (SOLU-MEDROL) 125 mg/2 mL injection 60 mg  60 mg Intravenous Q24H Epifanio Lesches, MD   60 mg at 09/08/17 1405  . multivitamin with minerals tablet 1 tablet  1 tablet Oral Daily Epifanio Lesches, MD   1 tablet at 09/09/17 0160  . ondansetron (ZOFRAN) tablet 4 mg  4 mg Oral Q6H PRN Amelia Jo, MD       Or  . ondansetron [facility]) injection 4 mg  4 mg Intravenous Q6H PRN Amelia Jo, MD      . pantoprazole (PROTONIX) injection 40 mg  40 mg Intravenous Q12H Epifanio Lesches, MD   40 mg at 09/09/17 1093  . sodium chloride flush (NS) 0.9 % injection 3 mL  3 mL Intravenous Q12H Epifanio Lesches, MD   3 mL at 09/09/17 0924  . traZODone (DESYREL) tablet 25 mg  25 mg Oral QHS PRN Amelia Jo,  MD         Objective: Vital signs in last 24 hours: Temp:  [98.2 F (36.8 C)-98.7 F (37.1 C)] 98.3 F (36.8 C) (02/06 0305) Pulse Rate:  [107-115] 110 (02/06 0305) Resp:  [18-20] 19 (02/06 0305) BP: (126-138)/(64-80) 128/75 (02/06 0305) SpO2:  [96 %-99 %] 96 % (02/06 0719) FiO2 (%):  [28 %] 28 % (02/06 0136) Weight:  [85 lb 8 oz (38.8 kg)] 85 lb 8 oz (38.8 kg) (02/06 0305)  Intake/Output from previous day: 02/05 0701 - 02/06 0700 In: 962.5 [P.O.:240; I.V.:472.5; IV Piggyback:250] Out: 1500 [Urine:1500] Intake/Output this shift: Total I/O In: -  Out: 825 [Urine:825]   Physical Exam  Thin, cachectic Wearing O2 without any respiratory distress Abdomen soft, nondistended Foley catheter in place draining clear yellow urine  Lab Results:  Recent Labs    09/08/17 0413  WBC 11.9*  HGB 10.4*  HCT 32.0*  PLT 421   BMET Recent Labs    09/08/17 0413  NA 141  K 3.3*  CL 107  CO2 26  GLUCOSE 107*  BUN 15  CREATININE 0.85  CALCIUM 8.0*   PT/INR No results for input(s): LABPROT, INR in the last 72 hours. ABG No results for input(s): PHART, HCO3 in the last 72 hours.  Invalid input(s): PCO2, PO2  Studies/Results: Ct Chest W Contrast  Result Date: 09/08/2017 CLINICAL DATA:  Palpable non pulsatile abdominal mass, weight loss, nonlocalized abdominal pain, history COPD, former smoker EXAM: CT CHEST, ABDOMEN, AND PELVIS WITH CONTRAST TECHNIQUE: Multidetector CT imaging of the chest, abdomen and pelvis was performed following the standard protocol during bolus administration of intravenous contrast. Sagittal and coronal MPR images reconstructed from axial data set. CONTRAST:  158mL ISOVUE-300 IOPAMIDOL (ISOVUE-300) INJECTION 61% IV. Dilute oral contrast. COMPARISON:  CT chest 09/05/2017, CT abdomen and pelvis 10/22/2011 FINDINGS: CT CHEST FINDINGS Cardiovascular: Atherosclerotic calcifications aorta, proximal great vessels and coronary arteries. Aorta normal caliber.  Pulmonary arteries grossly patent on nondedicated exam. No pericardial effusion. Mediastinum/Nodes: Esophagus unremarkable. Base of cervical region normal appearance. Normal sized mediastinal lymph nodes without thoracic adenopathy. Lungs/Pleura: Severe emphysematous changes consistent with COPD. Central peribronchial thickening. BILATERAL small pleural effusions and compressive atelectasis of the lower lobes, cannot exclude coexistent consolidation in the lower lobes. Remaining lungs clear. No pneumothorax. 5 mm RIGHT lung nodule image 47 unchanged. Additional tiny nodule anteromedial RIGHT upper lobe image 80 adjacent to anterior junction line. Musculoskeletal: No acute osseous findings CT ABDOMEN PELVIS FINDINGS Hepatobiliary: Numerous slightly low-attenuation masses throughout the liver compatible with widespread hepatic metastatic disease.  This includes a 4.8 x 3.6 cm lesion at the lateral inferior RIGHT lobe image 72 as well as a central lesion in the medial segment LEFT lobe 4.7 x 2.7 cm image 62. Gallbladder unremarkable. Pancreas: Normal appearance Spleen: Normal appearance Adrenals/Urinary Tract: Adrenal glands normal appearance. 4 mm nonobstructing calculus at upper pole RIGHT kidney. Marked hydronephrosis of LEFT kidney with cortical thinning and impaired nephrogram. Markedly dilated LEFT ureter 18 mm diameter extending into pelvis with high-grade obstruction of the distal LEFT ureter see below. Foley catheter decompresses urinary bladder. Stomach/Bowel: Increased stool in proximal colon. Appendix not definitely localized. Stomach and small bowel loops normal appearance. Vascular/Lymphatic: Atherosclerotic calcifications aorta and iliac arteries. Aorta normal caliber. Mildly enlarged LEFT inguinal and LEFT pelvic lymph nodes. Reproductive: Large central fluid collection within distended uterus with marked thinning of myometrium. Collection measures 7.6 x 6.2 x 5.4 cm. This likely represents  hydrometrocolpos, secondary to a large macrolobulated irregular soft tissue mass at the lower uterine segment and cervix either representing a cervical or endometrial neoplasm. Mass measures approximately 7.7 x 6.1 x 4.6 cm, appears to involve the posterior wall of the bladder, uterus, and distal LEFT ureter, at superior vagina. Scattered infiltrative changes of surrounding pelvic fat planes. Additional questionable volar nodule 13 mm diameter midline inferior to the pubic symphysis image 111. Other: Small amount of free fluid in pelvis. No free air. No hernia. Musculoskeletal: Large destructive bone lesion involving the anterior column and medial wall of the RIGHT acetabulum extending into RIGHT superior and inferior pubic rami as well as pubic body consistent with osseous metastasis. IMPRESSION: Large macrolobulated irregular heterogeneous enhancing soft tissue mass centered at the cervix/lower uterine segment measuring 7.6 x 6.2 x 5.4 cm in size most favoring a cervical neoplasm though endometrial neoplasm and vaginal neoplasm are not excluded. Direct extension of tumor to involve the vagina and posterior bladder with obstruction of the the LEFT ureter and marked LEFT hydroureteronephrosis and impaired LEFT renal function, as well as cervical obstruction with hydrometrocolpos. Multiple hepatic metastases. Extensive osseous metastatic lesion involving the anterior column and medial wall of the RIGHT acetabulum as well as the RIGHT pubic body and RIGHT superior/inferior pubic rami. Pelvic adenopathy. BILATERAL pleural effusions with compressive atelectasis and question minimal infiltrate in lower lobes. Aortic Atherosclerosis (ICD10-I70.0) and Emphysema (ICD10-J43.9). Electronically Signed   By: Lavonia Dana M.D.   On: 09/08/2017 18:09   Ct Abdomen Pelvis W Contrast  Result Date: 09/08/2017 CLINICAL DATA:  Palpable non pulsatile abdominal mass, weight loss, nonlocalized abdominal pain, history COPD, former smoker  EXAM: CT CHEST, ABDOMEN, AND PELVIS WITH CONTRAST TECHNIQUE: Multidetector CT imaging of the chest, abdomen and pelvis was performed following the standard protocol during bolus administration of intravenous contrast. Sagittal and coronal MPR images reconstructed from axial data set. CONTRAST:  171mL ISOVUE-300 IOPAMIDOL (ISOVUE-300) INJECTION 61% IV. Dilute oral contrast. COMPARISON:  CT chest 09/05/2017, CT abdomen and pelvis 10/22/2011 FINDINGS: CT CHEST FINDINGS Cardiovascular: Atherosclerotic calcifications aorta, proximal great vessels and coronary arteries. Aorta normal caliber. Pulmonary arteries grossly patent on nondedicated exam. No pericardial effusion. Mediastinum/Nodes: Esophagus unremarkable. Base of cervical region normal appearance. Normal sized mediastinal lymph nodes without thoracic adenopathy. Lungs/Pleura: Severe emphysematous changes consistent with COPD. Central peribronchial thickening. BILATERAL small pleural effusions and compressive atelectasis of the lower lobes, cannot exclude coexistent consolidation in the lower lobes. Remaining lungs clear. No pneumothorax. 5 mm RIGHT lung nodule image 47 unchanged. Additional tiny nodule anteromedial RIGHT upper lobe image 80 adjacent to anterior junction  line. Musculoskeletal: No acute osseous findings CT ABDOMEN PELVIS FINDINGS Hepatobiliary: Numerous slightly low-attenuation masses throughout the liver compatible with widespread hepatic metastatic disease. This includes a 4.8 x 3.6 cm lesion at the lateral inferior RIGHT lobe image 72 as well as a central lesion in the medial segment LEFT lobe 4.7 x 2.7 cm image 62. Gallbladder unremarkable. Pancreas: Normal appearance Spleen: Normal appearance Adrenals/Urinary Tract: Adrenal glands normal appearance. 4 mm nonobstructing calculus at upper pole RIGHT kidney. Marked hydronephrosis of LEFT kidney with cortical thinning and impaired nephrogram. Markedly dilated LEFT ureter 18 mm diameter extending  into pelvis with high-grade obstruction of the distal LEFT ureter see below. Foley catheter decompresses urinary bladder. Stomach/Bowel: Increased stool in proximal colon. Appendix not definitely localized. Stomach and small bowel loops normal appearance. Vascular/Lymphatic: Atherosclerotic calcifications aorta and iliac arteries. Aorta normal caliber. Mildly enlarged LEFT inguinal and LEFT pelvic lymph nodes. Reproductive: Large central fluid collection within distended uterus with marked thinning of myometrium. Collection measures 7.6 x 6.2 x 5.4 cm. This likely represents hydrometrocolpos, secondary to a large macrolobulated irregular soft tissue mass at the lower uterine segment and cervix either representing a cervical or endometrial neoplasm. Mass measures approximately 7.7 x 6.1 x 4.6 cm, appears to involve the posterior wall of the bladder, uterus, and distal LEFT ureter, at superior vagina. Scattered infiltrative changes of surrounding pelvic fat planes. Additional questionable volar nodule 13 mm diameter midline inferior to the pubic symphysis image 111. Other: Small amount of free fluid in pelvis. No free air. No hernia. Musculoskeletal: Large destructive bone lesion involving the anterior column and medial wall of the RIGHT acetabulum extending into RIGHT superior and inferior pubic rami as well as pubic body consistent with osseous metastasis. IMPRESSION: Large macrolobulated irregular heterogeneous enhancing soft tissue mass centered at the cervix/lower uterine segment measuring 7.6 x 6.2 x 5.4 cm in size most favoring a cervical neoplasm though endometrial neoplasm and vaginal neoplasm are not excluded. Direct extension of tumor to involve the vagina and posterior bladder with obstruction of the the LEFT ureter and marked LEFT hydroureteronephrosis and impaired LEFT renal function, as well as cervical obstruction with hydrometrocolpos. Multiple hepatic metastases. Extensive osseous metastatic lesion  involving the anterior column and medial wall of the RIGHT acetabulum as well as the RIGHT pubic body and RIGHT superior/inferior pubic rami. Pelvic adenopathy. BILATERAL pleural effusions with compressive atelectasis and question minimal infiltrate in lower lobes. Aortic Atherosclerosis (ICD10-I70.0) and Emphysema (ICD10-J43.9). Electronically Signed   By: Lavonia Dana M.D.   On: 09/08/2017 18:09   Dg Hip Unilat With Pelvis 2-3 Views Right  Result Date: 09/08/2017 CLINICAL DATA:  Right hip pain for several days, no known injury, initial encounter EXAM: DG HIP (WITH OR WITHOUT PELVIS) 2-3V RIGHT COMPARISON:  None. FINDINGS: There is a moth-eaten appearance involving the superior and inferior pubic ramus on the right. Discontinuity particularly in superior pubic ramus is noted likely related to pathologic fracture. No other definitive lytic lesions are seen. 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 CT of the abdomen and pelvis is recommended for further evaluation. These results will be called to the ordering clinician or representative by the Radiologist Assistant, and communication documented in the PACS or zVision Dashboard. Electronically Signed   By: Inez Catalina M.D.   On: 09/08/2017 14:56   CT scan was personally reviewed today.  Assessment/ Plan:  1.  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  Severe left hydroureteronephrosis-based on the degree of cortical thinning and the severity of hydronephrosis, there is very little residual parenchyma.  Given that she is asymptomatic at this point in time without signs or symptoms of infection or pain from obstruction, would defer renal drainage at this point in time as there is limited benefit given the degree of atrophy.   Overall creatinine is normal.  3.  Urinary retention-maintain Foley catheter at least today.  Consider voiding trial tomorrow or until she is more ambulatory.  Retention likely secondary to pain medications/and activity but may also be related to underlying pelvic mass.  Hollice Espy, MD  Case discussed with Dr. Vianne Bulls yesterday evening after CT scan results.   LOS: 4 days    Hollice Espy 09/09/2017

## 2017-09-09 NOTE — Progress Notes (Signed)
Notified Dr.Konidena of low potassium level. No new orders given at this time.

## 2017-09-09 NOTE — Consult Note (Signed)
[facility]  Date of admission:  09/05/2017  Inpatient day:  09/09/2017  Consulting physician:  Nelida Meuse.  Reason for Consultation:  Pt with cervical cancer by CT abdomen and pelvis.  Chief Complaint: Stacy Zhang is a 67 y.o. female with COPD who was admitted through the emergency room with bilateral pneumonia.  HPI: The presented on shortness of breath and cough for 2 days.  She was hypoxic.  White count was 37,000.  CT angiogram on 09/05/2017 revealed no evidence of pulmonary embolism but bilateral posterior basilar opacities concerning for pneumonia or atelectasis.  There was a 6 mm subpleural nodule in the right upper lobe.  She is currently on azithromycin and ceftriaxone.  She describes a 2-week history of being fatigued.  She has lost about 25 pounds in the past 2 years.  She has never had a mammogram, colonoscopy or pelvic exam (until this admission).  She had one child (unsure of when she was born).  She became post-menopausal "years ago".  She has had vaginal bleeding on and off for 2 1/2 years.  During this admission, she noted difficulty voiding.  She had a Foley catheter placed with difficulty.  She states that "blood came out" (unsure if vaginal bleeding or hematuria).  She noted back and hip pain.  Hip films on 09/08/2017 revealed moth-eaten appearance of the superior and inferior pubic rami on the right likely related to metastatic disease. CT of the abdomen and pelvis was recommended.  Chest, abdomen, and pelvic CT on 09/08/2017 revealed a large macrolobulated irregular heterogeneous enhancing soft tissue mass centered at the cervix/lower uterine segment measuring 7.6 x 6.2 x 5.4 cm in size most favoring a cervical neoplasm though endometrial neoplasm and vaginal neoplasm were not excluded.  There was direct extension of tumor to involve the vagina and posterior bladder with obstruction of the the LEFT ureter and marked LEFT  hydroureteronephrosis and impaired LEFT renal function, as well as cervical obstruction with hydrometrocolpos.  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  There was extensive osseous metastatic lesion involving the anterior column and medial wall of the RIGHT acetabulum as well as the RIGHT pubic body and RIGHT superior/inferior pubic rami.  There was pelvic adenopathy.  There were BILATERAL pleural effusions with compressive atelectasis and question minimal infiltrate in lower lobes.  She has been seen by Dr. Hollice Espy.  There was felt limited benefit of left renal drainage secondary to the degree of renal atrophy, normal creainine, and her being asymptomatic from the obstruction.  Symptomatically, she is fatigued.   Past Medical History:  Diagnosis Date  . COPD (chronic obstructive pulmonary disease) ([place])     History reviewed. No pertinent surgical history.  History reviewed. No pertinent family history.  Social History:  reports that she has quit smoking. she has never used smokeless tobacco. She reports that she does not drink alcohol. Her drug history is not on file.  She previously smoked 2 packs/day.  She quit smoking 30 years ago.  She denies any exposure to radiation or toxins.  She has lived alone x a few months.  She previously had a boyfriend of 20 years.  She does not know where her daughter lives.  She has no medical power of attorney.  She is alone today.  Allergies: No Known Allergies  Medications Prior to Admission  Medication Sig Dispense Refill  . COMBIVENT RESPIMAT 20-100 MCG/ACT AERS respimat Inhale 2 puffs into the lungs 2 (two) times daily.    Marland Kitchen  fluticasone (FLONASE) 50 MCG/ACT nasal spray Place 1 spray into both nostrils daily.    Marland Kitchen sulfamethoxazole-trimethoprim (BACTRIM DS,SEPTRA DS) 800-160 MG tablet Take 1 tablet by mouth 2 (two) times daily. (Patient not taking: Reported on 09/04/2017) 14 tablet 0  . valACYclovir (VALTREX) 1000 MG tablet Take 1 tablet (1,000 mg  total) by mouth 3 (three) times daily. (Patient not taking: Reported on 09/04/2017) 30 tablet 0    Review of Systems: GENERAL:  Fatigued.  Minimally active.  No fevers or sweats.  Weight loss of 25 pounds in 2 years. PERFORMANCE STATUS (ECOG):  2 HEENT:  Runny nose.  No visual changes, sore throat, mouth sores or tenderness. Lungs: Shortness of breath.  Cough.  No hemoptysis. Cardiac:  No chest pain, palpitations, orthopnea, or PND. GI:  Doesn't eat much.  Poor appetite.  Constipation.  No nausea, vomiting, diarrhea, melena or hematochezia. GU:  Difficulty voiding.  No urgency, frequency, dysuria, or hematuria.  Bleeding on/off x 2 1/2 years. Musculoskeletal:  Right hip pain radiates to back then left side.  No muscle tenderness. Extremities:  No pain or swelling. Skin:  No rashes or skin changes. Neuro:  Difficulty standing secondary to generalized weakness.  No headache, numbness or weakness, balance or coordination issues. Endocrine:  No diabetes, thyroid issues, hot flashes or night sweats. Psych:  "Upset all of the time". Pain:  No focal pain. Review of systems:  All other systems reviewed and found to be negative.  Physical Exam:  Blood pressure 129/73, pulse (!) 105, temperature 98.1 F (36.7 C), temperature source Oral, resp. rate 19, height '5\' 2"'$  (1.575 m), weight 85 lb 8 oz (38.8 kg), SpO2 95 %.  GENERAL:  Chronically ill appearing cachectic woman lying comfortably on the medical unit in no acute distress. MENTAL STATUS:  Alert and oriented to person, place and time. HEAD:  Shoulder length brown hair.  Temporal wasting.  Normocephalic, atraumatic, face symmetric, no Cushingoid features. EYES:  Brown eyes.  Pupils equal round and reactive to light and accomodation.  No conjunctivitis or scleral icterus. ENT:  Henderson in place.  Tongue ulceration.  Oropharynx clear without lesion.  Tongue normal.  Edentulous.  Mucous membranes moist.  RESPIRATORY:  Poor respiratory excursion.  Decreased  breath sounds at the bases.  Clear to auscultation without rales, wheezes or rhonchi. CARDIOVASCULAR:  Regular rate and rhythm without murmur, rub or gallop. ABDOMEN:  Suprapubic discomfort.  Soft, non-tender, with active bowel sounds, and no hepatosplenomegaly.  No masses. GU:  Foley catheter in place.  Urine clear yellow. SKIN:  No rashes, ulcers or lesions. EXTREMITIES: Thin.  No edema, no skin discoloration or tenderness.  No palpable cords. LYMPH NODES: No palpable cervical, supraclavicular, axillary or inguinal adenopathy  NEUROLOGICAL: Unremarkable. PSYCH:  Appropriate.   Results for orders placed or performed during the hospital encounter of 09/04/17 (from the past 48 hour(s))  CBC     Status: Abnormal   Collection Time: 09/08/17  4:13 AM  Result Value Ref Range   WBC 11.9 (H) 3.6 - 11.0 K/uL   RBC 3.68 (L) 3.80 - 5.20 MIL/uL   Hemoglobin 10.4 (L) 12.0 - 16.0 g/dL   HCT 32.0 (L) 35.0 - 47.0 %   MCV 87.0 80.0 - 100.0 fL   MCH 28.3 26.0 - 34.0 pg   MCHC 32.5 32.0 - 36.0 g/dL   RDW 14.5 11.5 - 14.5 %   Platelets 421 150 - 440 K/uL    Comment: Performed at [facility], [address] [place], [place] [address]  Basic metabolic panel     Status: Abnormal   Collection Time: 09/08/17  4:13 AM  Result Value Ref Range   Sodium 141 135 - 145 mmol/L   Potassium 3.3 (L) 3.5 - 5.1 mmol/L   Chloride 107 101 - 111 mmol/L   CO2 26 22 - 32 mmol/L   Glucose, Bld 107 (H) 65 - 99 mg/dL   BUN 15 6 - 20 mg/dL   Creatinine, Ser 0.85 0.44 - 1.00 mg/dL   Calcium 8.0 (L) 8.9 - 10.3 mg/dL   GFR calc non Af Amer >60 >60 mL/min   GFR calc Af Amer >60 >60 mL/min    Comment: (NOTE) The eGFR has been calculated using the CKD EPI equation. This calculation has not been validated in all clinical situations. eGFR's persistently <60 mL/min signify possible Chronic Kidney Disease.    Anion gap 8 5 - 15    Comment: Performed at [facility], [address] [place], [place]  [address]  Glucose, capillary     Status: None   Collection Time: 09/08/17  7:48 AM  Result Value Ref Range   Glucose-Capillary 85 65 - 99 mg/dL  Glucose, capillary     Status: Abnormal   Collection Time: 09/09/17  7:39 AM  Result Value Ref Range   Glucose-Capillary 108 (H) 65 - 99 mg/dL  Glucose, capillary     Status: None   Collection Time: 09/09/17 11:48 AM  Result Value Ref Range   Glucose-Capillary 90 65 - 99 mg/dL   Ct Chest W Contrast  Result Date: 09/08/2017 CLINICAL DATA:  Palpable non pulsatile abdominal mass, weight loss, nonlocalized abdominal pain, history COPD, former smoker EXAM: CT CHEST, ABDOMEN, AND PELVIS WITH CONTRAST TECHNIQUE: Multidetector CT imaging of the chest, abdomen and pelvis was performed following the standard protocol during bolus administration of intravenous contrast. Sagittal and coronal MPR images reconstructed from axial data set. CONTRAST:  121m ISOVUE-300 IOPAMIDOL (ISOVUE-300) INJECTION 61% IV. Dilute oral contrast. COMPARISON:  CT chest 09/05/2017, CT abdomen and pelvis 10/22/2011 FINDINGS: CT CHEST FINDINGS Cardiovascular: Atherosclerotic calcifications aorta, proximal great vessels and coronary arteries. Aorta normal caliber. Pulmonary arteries grossly patent on nondedicated exam. No pericardial effusion. Mediastinum/Nodes: Esophagus unremarkable. Base of cervical region normal appearance. Normal sized mediastinal lymph nodes without thoracic adenopathy. Lungs/Pleura: Severe emphysematous changes consistent with COPD. Central peribronchial thickening. BILATERAL small pleural effusions and compressive atelectasis of the lower lobes, cannot exclude coexistent consolidation in the lower lobes. Remaining lungs clear. No pneumothorax. 5 mm RIGHT lung nodule image 47 unchanged. Additional tiny nodule anteromedial RIGHT upper lobe image 80 adjacent to anterior junction line. Musculoskeletal: No acute osseous findings CT ABDOMEN PELVIS FINDINGS Hepatobiliary: Numerous  slightly low-attenuation masses throughout the liver compatible with widespread hepatic metastatic disease. This includes a 4.8 x 3.6 cm lesion at the lateral inferior RIGHT lobe image 72 as well as a central lesion in the medial segment LEFT lobe 4.7 x 2.7 cm image 62. Gallbladder unremarkable. Pancreas: Normal appearance Spleen: Normal appearance Adrenals/Urinary Tract: Adrenal glands normal appearance. 4 mm nonobstructing calculus at upper pole RIGHT kidney. Marked hydronephrosis of LEFT kidney with cortical thinning and impaired nephrogram. Markedly dilated LEFT ureter 18 mm diameter extending into pelvis with high-grade obstruction of the distal LEFT ureter see below. Foley catheter decompresses urinary bladder. Stomach/Bowel: Increased stool in proximal colon. Appendix not definitely localized. Stomach and small bowel loops normal appearance. Vascular/Lymphatic: Atherosclerotic calcifications aorta and iliac arteries. Aorta normal caliber.  Mildly enlarged LEFT inguinal and LEFT pelvic lymph nodes. Reproductive: Large central fluid collection within distended uterus with marked thinning of myometrium. Collection measures 7.6 x 6.2 x 5.4 cm. This likely represents hydrometrocolpos, secondary to a large macrolobulated irregular soft tissue mass at the lower uterine segment and cervix either representing a cervical or endometrial neoplasm. Mass measures approximately 7.7 x 6.1 x 4.6 cm, appears to involve the posterior wall of the bladder, uterus, and distal LEFT ureter, at superior vagina. Scattered infiltrative changes of surrounding pelvic fat planes. Additional questionable volar nodule 13 mm diameter midline inferior to the pubic symphysis image 111. Other: Small amount of free fluid in pelvis. No free air. No hernia. Musculoskeletal: Large destructive bone lesion involving the anterior column and medial wall of the RIGHT acetabulum extending into RIGHT superior and inferior pubic rami as well as pubic body  consistent with osseous metastasis. IMPRESSION: Large macrolobulated irregular heterogeneous enhancing soft tissue mass centered at the cervix/lower uterine segment measuring 7.6 x 6.2 x 5.4 cm in size most favoring a cervical neoplasm though endometrial neoplasm and vaginal neoplasm are not excluded. Direct extension of tumor to involve the vagina and posterior bladder with obstruction of the the LEFT ureter and marked LEFT hydroureteronephrosis and impaired LEFT renal function, as well as cervical obstruction with hydrometrocolpos. Multiple hepatic metastases. Extensive osseous metastatic lesion involving the anterior column and medial wall of the RIGHT acetabulum as well as the RIGHT pubic body and RIGHT superior/inferior pubic rami. Pelvic adenopathy. BILATERAL pleural effusions with compressive atelectasis and question minimal infiltrate in lower lobes. Aortic Atherosclerosis (ICD10-I70.0) and Emphysema (ICD10-J43.9). Electronically Signed   By: Lavonia Dana M.D.   On: 09/08/2017 18:09   Ct Abdomen Pelvis W Contrast  Result Date: 09/08/2017 CLINICAL DATA:  Palpable non pulsatile abdominal mass, weight loss, nonlocalized abdominal pain, history COPD, former smoker EXAM: CT CHEST, ABDOMEN, AND PELVIS WITH CONTRAST TECHNIQUE: Multidetector CT imaging of the chest, abdomen and pelvis was performed following the standard protocol during bolus administration of intravenous contrast. Sagittal and coronal MPR images reconstructed from axial data set. CONTRAST:  149m ISOVUE-300 IOPAMIDOL (ISOVUE-300) INJECTION 61% IV. Dilute oral contrast. COMPARISON:  CT chest 09/05/2017, CT abdomen and pelvis 10/22/2011 FINDINGS: CT CHEST FINDINGS Cardiovascular: Atherosclerotic calcifications aorta, proximal great vessels and coronary arteries. Aorta normal caliber. Pulmonary arteries grossly patent on nondedicated exam. No pericardial effusion. Mediastinum/Nodes: Esophagus unremarkable. Base of cervical region normal appearance.  Normal sized mediastinal lymph nodes without thoracic adenopathy. Lungs/Pleura: Severe emphysematous changes consistent with COPD. Central peribronchial thickening. BILATERAL small pleural effusions and compressive atelectasis of the lower lobes, cannot exclude coexistent consolidation in the lower lobes. Remaining lungs clear. No pneumothorax. 5 mm RIGHT lung nodule image 47 unchanged. Additional tiny nodule anteromedial RIGHT upper lobe image 80 adjacent to anterior junction line. Musculoskeletal: No acute osseous findings CT ABDOMEN PELVIS FINDINGS Hepatobiliary: Numerous slightly low-attenuation masses throughout the liver compatible with widespread hepatic metastatic disease. This includes a 4.8 x 3.6 cm lesion at the lateral inferior RIGHT lobe image 72 as well as a central lesion in the medial segment LEFT lobe 4.7 x 2.7 cm image 62. Gallbladder unremarkable. Pancreas: Normal appearance Spleen: Normal appearance Adrenals/Urinary Tract: Adrenal glands normal appearance. 4 mm nonobstructing calculus at upper pole RIGHT kidney. Marked hydronephrosis of LEFT kidney with cortical thinning and impaired nephrogram. Markedly dilated LEFT ureter 18 mm diameter extending into pelvis with high-grade obstruction of the distal LEFT ureter see below. Foley catheter decompresses urinary bladder. Stomach/Bowel: Increased stool  in proximal colon. Appendix not definitely localized. Stomach and small bowel loops normal appearance. Vascular/Lymphatic: Atherosclerotic calcifications aorta and iliac arteries. Aorta normal caliber. Mildly enlarged LEFT inguinal and LEFT pelvic lymph nodes. Reproductive: Large central fluid collection within distended uterus with marked thinning of myometrium. Collection measures 7.6 x 6.2 x 5.4 cm. This likely represents hydrometrocolpos, secondary to a large macrolobulated irregular soft tissue mass at the lower uterine segment and cervix either representing a cervical or endometrial neoplasm.  Mass measures approximately 7.7 x 6.1 x 4.6 cm, appears to involve the posterior wall of the bladder, uterus, and distal LEFT ureter, at superior vagina. Scattered infiltrative changes of surrounding pelvic fat planes. Additional questionable volar nodule 13 mm diameter midline inferior to the pubic symphysis image 111. Other: Small amount of free fluid in pelvis. No free air. No hernia. Musculoskeletal: Large destructive bone lesion involving the anterior column and medial wall of the RIGHT acetabulum extending into RIGHT superior and inferior pubic rami as well as pubic body consistent with osseous metastasis. IMPRESSION: Large macrolobulated irregular heterogeneous enhancing soft tissue mass centered at the cervix/lower uterine segment measuring 7.6 x 6.2 x 5.4 cm in size most favoring a cervical neoplasm though endometrial neoplasm and vaginal neoplasm are not excluded. Direct extension of tumor to involve the vagina and posterior bladder with obstruction of the the LEFT ureter and marked LEFT hydroureteronephrosis and impaired LEFT renal function, as well as cervical obstruction with hydrometrocolpos. Multiple hepatic metastases. Extensive osseous metastatic lesion involving the anterior column and medial wall of the RIGHT acetabulum as well as the RIGHT pubic body and RIGHT superior/inferior pubic rami. Pelvic adenopathy. BILATERAL pleural effusions with compressive atelectasis and question minimal infiltrate in lower lobes. Aortic Atherosclerosis (ICD10-I70.0) and Emphysema (ICD10-J43.9). Electronically Signed   By: Lavonia Dana M.D.   On: 09/08/2017 18:09   Dg Hip Unilat With Pelvis 2-3 Views Right  Result Date: 09/08/2017 CLINICAL DATA:  Right hip pain for several days, no known injury, initial encounter EXAM: DG HIP (WITH OR WITHOUT PELVIS) 2-3V RIGHT COMPARISON:  None. FINDINGS: There is a moth-eaten appearance involving the superior and inferior pubic ramus on the right. Discontinuity particularly in  superior pubic ramus is noted likely related to pathologic fracture. No other definitive lytic lesions are seen. 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 CT of the abdomen and pelvis is recommended for further evaluation. These results will be called to the ordering clinician or representative by the Radiologist Assistant, and communication documented in the PACS or zVision Dashboard. Electronically Signed   By: Inez Catalina M.D.   On: 09/08/2017 14:56    Assessment:  The patient is a 67 y.o. woman with probable metastatic cervical cancer.  She presented with a fatigue, shortness of breath, a 25 pound weight loss, and intermittent vaginal bleeding for 2 1/2 years.  Chest, abdomen, and pelvic CT on 09/08/2017 revealed a large macrolobulated irregular heterogeneous enhancing soft tissue mass centered at the cervix/lower uterine segment measuring 7.6 x 6.2 x 5.4 cm in size most favoring a cervical neoplasm though endometrial neoplasm and vaginal neoplasm were not excluded.  There was direct extension of tumor to involve the vagina and posterior bladder with obstruction of the the LEFT ureter and marked LEFT hydroureteronephrosis and impaired LEFT renal function, as well as cervical obstruction with hydrometrocolpos.  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  There was extensive osseous metastatic lesion involving the anterior column and medial  wall of the RIGHT acetabulum as well as the RIGHT pubic body and RIGHT superior/inferior pubic rami.  There was pelvic adenopathy.  There were BILATERAL pleural effusions with compressive atelectasis and question minimal infiltrate in lower lobes.  Symptomatically, she is fatigued.  Exam reveals cachexia, decreased breath sounds at the bases, and suprapubic discomfort.  Plan:   1.  Oncology:  Discussed findings on recent imaging with patient.  Suspect stage IV cervical cancer.  She has  undergone pelvic exam today and biopsy.  Await biopsy results.  Appreciate gynecology consult.    Discussed treatment is palliative.  Review images at tumor board tomorrow.  She is not a surgical candidate.  Discussed palliative chemotherapy (carboplatin + Taxol and +/- Avastin) if metastatic cervical cancer is confirmed.  Side effects of treatment briefly reviewed.  She is unsure about treatment.  Discussed supportive care/Hospice.  Palliative care consult.  Multiple questions were asked and answered.  2.  Urology:  Left hydronephrosis long standing with minimal renal parenchyma not felt to benefit from nephrostomy tube placement.  Appreciate urology consult.  3.  Disposition:  Difficult social situation.  Patient has no support or medical power of attorney.  She is estranged from her daughter. She is unable to care for herself secondary to generalized weakness.  Preliminary discussion today regarding code status.    Thank you for allowing me to participate in Stacy Zhang 's care.  I will follow her closely with you while hospitalized and after discharge in the outpatient department.   Lequita Asal, MD  09/09/2017, 9:33 PM

## 2017-09-10 ENCOUNTER — Other Ambulatory Visit: Payer: Self-pay | Admitting: Hematology and Oncology

## 2017-09-10 DIAGNOSIS — J9601 Acute respiratory failure with hypoxia: Secondary | ICD-10-CM | POA: Diagnosis not present

## 2017-09-10 DIAGNOSIS — R634 Abnormal weight loss: Secondary | ICD-10-CM | POA: Diagnosis not present

## 2017-09-10 DIAGNOSIS — N1330 Unspecified hydronephrosis: Secondary | ICD-10-CM | POA: Diagnosis not present

## 2017-09-10 DIAGNOSIS — C7951 Secondary malignant neoplasm of bone: Secondary | ICD-10-CM | POA: Diagnosis not present

## 2017-09-10 DIAGNOSIS — J189 Pneumonia, unspecified organism: Secondary | ICD-10-CM | POA: Diagnosis not present

## 2017-09-10 DIAGNOSIS — R310 Gross hematuria: Secondary | ICD-10-CM | POA: Diagnosis not present

## 2017-09-10 DIAGNOSIS — C787 Secondary malignant neoplasm of liver and intrahepatic bile duct: Secondary | ICD-10-CM | POA: Diagnosis not present

## 2017-09-10 DIAGNOSIS — J449 Chronic obstructive pulmonary disease, unspecified: Secondary | ICD-10-CM | POA: Diagnosis not present

## 2017-09-10 DIAGNOSIS — N939 Abnormal uterine and vaginal bleeding, unspecified: Secondary | ICD-10-CM | POA: Diagnosis not present

## 2017-09-10 DIAGNOSIS — Z515 Encounter for palliative care: Secondary | ICD-10-CM | POA: Diagnosis not present

## 2017-09-10 DIAGNOSIS — M25551 Pain in right hip: Secondary | ICD-10-CM

## 2017-09-10 DIAGNOSIS — R0602 Shortness of breath: Secondary | ICD-10-CM | POA: Diagnosis not present

## 2017-09-10 DIAGNOSIS — C538 Malignant neoplasm of overlapping sites of cervix uteri: Secondary | ICD-10-CM | POA: Diagnosis not present

## 2017-09-10 DIAGNOSIS — R339 Retention of urine, unspecified: Secondary | ICD-10-CM | POA: Diagnosis not present

## 2017-09-10 LAB — CULTURE, BLOOD (ROUTINE X 2)
CULTURE: NO GROWTH
Culture: NO GROWTH
SPECIAL REQUESTS: ADEQUATE
Special Requests: ADEQUATE

## 2017-09-10 LAB — GLUCOSE, CAPILLARY: Glucose-Capillary: 104 mg/dL — ABNORMAL HIGH (ref 65–99)

## 2017-09-10 NOTE — Consult Note (Signed)
NAME:  Stacy Zhang, Stacy Zhang                  ACCOUNT NO.:  [id number]  MEDICAL RECORD NO.:  [id number]  LOCATION:  ED18A                        FACILITY:  [facility]  PHYSICIAN:  Thomas Schermerhorn, MDDATE OF BIRTH:  05/24/1951  DATE OF CONSULTATION: DATE OF DISCHARGE:                                CONSULTATION   REQUESTING PHYSICIAN:  Snehalatha Konidena, MD.  HISTORY OF PRESENT ILLNESS:  This is a 67-year-old female, gravida 2, para 2, who was admitted through the emergency department on September 05, 2017, for acute onset of shortness of breath, productive cough, and lethargy.  The patient was found to be hypoxic and was admitted by the hospitalist service.  The patient had not been receiving any care and not been seen by a gynecologist as long as she can remember.  The patient denied any pelvic pain, but she did notice some recent vaginal bleeding over the past 3-5 days.  The patient denies any family history of gynecologic cancer, but admits to not keeping up with her family history throughout the years.  A CT scan was performed on September 08, 2017, with specifically a large central fluid collection within distended uterus with marked thinning of the myometrium.  The collection measured 7.6 x 6.2 x 5.4 cm.  This likely represents a hydrometrocolpos, secondary to a large macrolobulated irregular soft tissue mass at the lower uterine segment and cervix, either representing a cervical or endometrial neoplasm.  The mass measures approximately 7.7 x 6.1 x 4.6 cm and appears to involve the posterior wall of the bladder, uterus, and distal left ureter at superior vagina.  There appears to be significant obstruction of the left ureter with marked left hydronephrosis and impaired left renal function, multiple hepatic metastasis are identified as well as extensive osseous metastatic lesion involving the right acetabulum, the right pubic body, and the right superior inferior pubic ramus.  There is  significant pelvic adenopathy and noted to be pleural effusions bilaterally.  The patient was admitted with a low-grade temperature of 100.3 and an elevated white blood count and a lactic acid at 2.2.  Over the next several days, Urology was consulted.  Dr. Brandon catheterized the patient to alleviate urinary retention.  The patient was placed on IV antibiotics with improvement of her lung function, although she remains on oxygen.  The patient has a known history of COPD.  PAST MEDICAL HISTORY:  COPD.  PAST SURGICAL HISTORY:  A least 1 cesarean section.  The patient is a poor historian.  REVIEW OF SYSTEMS:  GENERAL:  Positive for fever, fatigue, generalized weakness.  EYES:  No blurred or double vision.  EARS, NOSE, AND THROAT: No tinnitus or ear pain.  RESPIRATORY:  Positive for productive cough, shortness of breath, and wheezing.  No hemoptysis.  CARDIOVASCULAR: Left-sided chest pain under the ribs with cough and deep inspiration. No orthopnea or edema.  GASTROINTESTINAL:  No nausea, vomiting, diarrhea, or abdominal pain.  GENITOURINARY:  No pelvic pain.  Positive urinary retention.  No hematuria.  ENDOCRINE:  No polyuria.  No nocturia.  HEMATOLOGY:  No bleeding.  MUSCULOSKELETAL:  Positive joint pain/hip on the right.  NEUROLOGIC:  No focal weakness.  PSYCHIATRIC: Does   admit to anxiety and mild depression.  SOCIAL HISTORY:  Former smoker.  No current alcohol use.  FAMILY HISTORY:  No gynecologic cancers on file and the patient denies knowing her family well.  DRUG ALLERGIES:  No known drug allergies.  MEDICATIONS:  Taken at home: 1. Combivent. 2. Flonase. 3. Septra DS. 4. Valtrex.  PHYSICAL EXAMINATION:  GENERAL:  The patient was brought to [facility] for evaluation and examination on an appropriate bed containing stirrups.  She is an ill-appearing, cachectic white female with supplemental oxygen.  The patient is able to speak without difficulty. VITAL SIGNS:   Blood pressure 129/73, pulse 105, temperature 98.1. EYES:  Pupils equal, round, reactive to light and accommodation. HEENT:  Head atraumatic, normocephalic.  Oropharynx clear. NECK:  No supraclavicular masses. LUNGS:  Decreased breath sounds bilaterally.  No rales. CARDIOVASCULAR:  Mild tachycardia.  Regular rhythm.  No murmur. ABDOMEN:  Nondistended.  Hypoactive bowel sounds.  No palpable mass. GYNECOLOGIC:  External genitalia, no lesions, normal labia.  Speculum exam with a Pederson speculum.  The speculum is gently placed into the vagina with some discomfort, noted vaginal stenosis with 5 mL of serosanguinous bloody discharge.  Speculum could only partially be opened with discomfort due to an obstructing mass at the upper vagina. Blind biopsies of this mass were performed with 3 separate small samples submitted.  Monsel's was placed at the upper vagina for hemostasis. Digital exam revealed an obstructing mass at the upper vagina involving the parametrium and extending anteriorly and posteriorly. EXTREMITIES:  No pedal edema, cyanosis, or clubbing. NEUROLOGIC:  No focal weakness. SKIN:  No obvious rash, lesion, or ulcer.  ASSESSMENT:  Large cervical mass most certain to be cervical cancer with presumed advanced staging including metastasis to the liver and bony structures of the right acetabulum and pelvic girdle.  Erosion into the posterior aspect of the bladder and involvement of the left ureter. Metastatic involvement also includes the liver.  PLANS: 1. Cervical biopsies as above. 2. Dr. Cecille Po, [place] oncologist, is aware of the patient's     status and would like to evaluate the patient next Wednesday,     September 16, 2017, in the [facility].  Cervical biopsies were given to Dr. Darryl Lent, pathologist,     [facility], and a rush status was placed.  There will also be     microsatellite instability testing done on these samples as per      recommendation of Dr. Fransisca Connors.          ______________________________ Laverta Baltimore, MD     TS/MEDQ  D:  09/09/2017  T:  09/10/2017  Job:  [id number]

## 2017-09-10 NOTE — Progress Notes (Signed)
[place] [facility] [place] NAME: Stacy Zhang    MR#:  [id number]  DATE OF BIRTH:  1951/04/08  Feels weak but no complaints patient had cervical biopsy yesterday.  On that she says that she is missing her boyfriend.  He is alert and oriented and also noticed that she has advanced cancer.  CHIEF COMPLAINT:   Chief Complaint  Patient presents with  . Shortness of Breath  . Respiratory Distress    REVIEW OF SYSTEMS:   Review of Systems  Constitutional: Positive for weight loss. Negative for chills and fever.  HENT: Negative for hearing loss.   Eyes: Negative for blurred vision, double vision and photophobia.  Respiratory: Positive for cough. Negative for hemoptysis and shortness of breath.   Cardiovascular: Negative for palpitations, orthopnea and leg swelling.  Gastrointestinal: Negative for abdominal pain, diarrhea and vomiting.  Genitourinary: Negative for dysuria and urgency.  Musculoskeletal: Negative for myalgias and neck pain.  Skin: Negative for rash.  Neurological: Positive for weakness. Negative for dizziness, focal weakness, seizures and headaches.  Psychiatric/Behavioral: Negative for memory loss. The patient does not have insomnia.       DRUG ALLERGIES:  No Known Allergies  VITALS:  Blood pressure 133/64, pulse (!) 115, temperature 98.5 F (36.9 C), temperature source Oral, resp. rate 19, height 5\' 2"  (1.575 m), weight 39.6 kg (87 lb 4.8 oz), SpO2 94 %.  PHYSICAL EXAMINATION:  GENERAL:  67 y.o.-year-old patient lying in the bed with no acute distress.  Patient is cachectic/has mouth ulcers. EYES: Pupils equal, round, reactive to light and accommodation. No scleral icterus. Extraocular muscles intact.  HEENT: Head atraumatic, normocephalic. Oropharynx and nasopharynx clear.  NECK:  Supple, no jugular venous distention. No thyroid enlargement, no tenderness.  LUNGS: No wheezing today but diminished breath sounds  bilaterally. CARDIOVASCULAR: S1, S2 normal. No murmurs, rubs, or gallops.  ABDOMEN: Soft, nontender, nondistended. Bowel sounds present. No organomegaly or mass.  EXTREMITIES: No pedal edema, cyanosis, or clubbing.  No tenderness in the right hip, range of motion is slightly limited because of the pain. NEUROLOGIC: Cranial nerves II through XII are intact. Muscle strength 5/5 in all extremities. Sensation intact. Gait not checked.  PSYCHIATRIC: The patient is alert and oriented x 3.  SKIN: No obvious rash, lesion, or ulcer.    LABORATORY PANEL:   CBC Recent Labs  Lab 09/08/17 0413  WBC 11.9*  HGB 10.4*  HCT 32.0*  PLT 421   ------------------------------------------------------------------------------------------------------------------  Chemistries  Recent Labs  Lab 09/04/17 2222  09/08/17 0413  NA 137   < > 141  K 4.0   < > 3.3*  CL 102   < > 107  CO2 23   < > 26  GLUCOSE 110*   < > 107*  BUN 16   < > 15  CREATININE 1.08*   < > 0.85  CALCIUM 8.8*   < > 8.0*  AST 67*  --   --   ALT 24  --   --   ALKPHOS 106  --   --   BILITOT 0.7  --   --    < > = values in this interval not displayed.   ------------------------------------------------------------------------------------------------------------------  Cardiac Enzymes Recent Labs  Lab 09/05/17 0323  TROPONINI 0.03*   ------------------------------------------------------------------------------------------------------------------  RADIOLOGY:  Ct Chest W Contrast  Result Date: 09/08/2017 CLINICAL DATA:  Palpable non pulsatile abdominal mass, weight loss, nonlocalized abdominal pain, history COPD, former smoker EXAM: CT CHEST,  ABDOMEN, AND PELVIS WITH CONTRAST TECHNIQUE: Multidetector CT imaging of the chest, abdomen and pelvis was performed following the standard protocol during bolus administration of intravenous contrast. Sagittal and coronal MPR images reconstructed from axial data set. CONTRAST:  128mL  ISOVUE-300 IOPAMIDOL (ISOVUE-300) INJECTION 61% IV. Dilute oral contrast. COMPARISON:  CT chest 09/05/2017, CT abdomen and pelvis 10/22/2011 FINDINGS: CT CHEST FINDINGS Cardiovascular: Atherosclerotic calcifications aorta, proximal great vessels and coronary arteries. Aorta normal caliber. Pulmonary arteries grossly patent on nondedicated exam. No pericardial effusion. Mediastinum/Nodes: Esophagus unremarkable. Base of cervical region normal appearance. Normal sized mediastinal lymph nodes without thoracic adenopathy. Lungs/Pleura: Severe emphysematous changes consistent with COPD. Central peribronchial thickening. BILATERAL small pleural effusions and compressive atelectasis of the lower lobes, cannot exclude coexistent consolidation in the lower lobes. Remaining lungs clear. No pneumothorax. 5 mm RIGHT lung nodule image 47 unchanged. Additional tiny nodule anteromedial RIGHT upper lobe image 80 adjacent to anterior junction line. Musculoskeletal: No acute osseous findings CT ABDOMEN PELVIS FINDINGS Hepatobiliary: Numerous slightly low-attenuation masses throughout the liver compatible with widespread hepatic metastatic disease. This includes a 4.8 x 3.6 cm lesion at the lateral inferior RIGHT lobe image 72 as well as a central lesion in the medial segment LEFT lobe 4.7 x 2.7 cm image 62. Gallbladder unremarkable. Pancreas: Normal appearance Spleen: Normal appearance Adrenals/Urinary Tract: Adrenal glands normal appearance. 4 mm nonobstructing calculus at upper pole RIGHT kidney. Marked hydronephrosis of LEFT kidney with cortical thinning and impaired nephrogram. Markedly dilated LEFT ureter 18 mm diameter extending into pelvis with high-grade obstruction of the distal LEFT ureter see below. Foley catheter decompresses urinary bladder. Stomach/Bowel: Increased stool in proximal colon. Appendix not definitely localized. Stomach and small bowel loops normal appearance. Vascular/Lymphatic: Atherosclerotic  calcifications aorta and iliac arteries. Aorta normal caliber. Mildly enlarged LEFT inguinal and LEFT pelvic lymph nodes. Reproductive: Large central fluid collection within distended uterus with marked thinning of myometrium. Collection measures 7.6 x 6.2 x 5.4 cm. This likely represents hydrometrocolpos, secondary to a large macrolobulated irregular soft tissue mass at the lower uterine segment and cervix either representing a cervical or endometrial neoplasm. Mass measures approximately 7.7 x 6.1 x 4.6 cm, appears to involve the posterior wall of the bladder, uterus, and distal LEFT ureter, at superior vagina. Scattered infiltrative changes of surrounding pelvic fat planes. Additional questionable volar nodule 13 mm diameter midline inferior to the pubic symphysis image 111. Other: Small amount of free fluid in pelvis. No free air. No hernia. Musculoskeletal: Large destructive bone lesion involving the anterior column and medial wall of the RIGHT acetabulum extending into RIGHT superior and inferior pubic rami as well as pubic body consistent with osseous metastasis. IMPRESSION: Large macrolobulated irregular heterogeneous enhancing soft tissue mass centered at the cervix/lower uterine segment measuring 7.6 x 6.2 x 5.4 cm in size most favoring a cervical neoplasm though endometrial neoplasm and vaginal neoplasm are not excluded. Direct extension of tumor to involve the vagina and posterior bladder with obstruction of the the LEFT ureter and marked LEFT hydroureteronephrosis and impaired LEFT renal function, as well as cervical obstruction with hydrometrocolpos. Multiple hepatic metastases. Extensive osseous metastatic lesion involving the anterior column and medial wall of the RIGHT acetabulum as well as the RIGHT pubic body and RIGHT superior/inferior pubic rami. Pelvic adenopathy. BILATERAL pleural effusions with compressive atelectasis and question minimal infiltrate in lower lobes. Aortic Atherosclerosis  (ICD10-I70.0) and Emphysema (ICD10-J43.9). Electronically Signed   By: Lavonia Dana M.D.   On: 09/08/2017 18:09   Ct Abdomen Pelvis W  Contrast  Result Date: 09/08/2017 CLINICAL DATA:  Palpable non pulsatile abdominal mass, weight loss, nonlocalized abdominal pain, history COPD, former smoker EXAM: CT CHEST, ABDOMEN, AND PELVIS WITH CONTRAST TECHNIQUE: Multidetector CT imaging of the chest, abdomen and pelvis was performed following the standard protocol during bolus administration of intravenous contrast. Sagittal and coronal MPR images reconstructed from axial data set. CONTRAST:  117mL ISOVUE-300 IOPAMIDOL (ISOVUE-300) INJECTION 61% IV. Dilute oral contrast. COMPARISON:  CT chest 09/05/2017, CT abdomen and pelvis 10/22/2011 FINDINGS: CT CHEST FINDINGS Cardiovascular: Atherosclerotic calcifications aorta, proximal great vessels and coronary arteries. Aorta normal caliber. Pulmonary arteries grossly patent on nondedicated exam. No pericardial effusion. Mediastinum/Nodes: Esophagus unremarkable. Base of cervical region normal appearance. Normal sized mediastinal lymph nodes without thoracic adenopathy. Lungs/Pleura: Severe emphysematous changes consistent with COPD. Central peribronchial thickening. BILATERAL small pleural effusions and compressive atelectasis of the lower lobes, cannot exclude coexistent consolidation in the lower lobes. Remaining lungs clear. No pneumothorax. 5 mm RIGHT lung nodule image 47 unchanged. Additional tiny nodule anteromedial RIGHT upper lobe image 80 adjacent to anterior junction line. Musculoskeletal: No acute osseous findings CT ABDOMEN PELVIS FINDINGS Hepatobiliary: Numerous slightly low-attenuation masses throughout the liver compatible with widespread hepatic metastatic disease. This includes a 4.8 x 3.6 cm lesion at the lateral inferior RIGHT lobe image 72 as well as a central lesion in the medial segment LEFT lobe 4.7 x 2.7 cm image 62. Gallbladder unremarkable. Pancreas:  Normal appearance Spleen: Normal appearance Adrenals/Urinary Tract: Adrenal glands normal appearance. 4 mm nonobstructing calculus at upper pole RIGHT kidney. Marked hydronephrosis of LEFT kidney with cortical thinning and impaired nephrogram. Markedly dilated LEFT ureter 18 mm diameter extending into pelvis with high-grade obstruction of the distal LEFT ureter see below. Foley catheter decompresses urinary bladder. Stomach/Bowel: Increased stool in proximal colon. Appendix not definitely localized. Stomach and small bowel loops normal appearance. Vascular/Lymphatic: Atherosclerotic calcifications aorta and iliac arteries. Aorta normal caliber. Mildly enlarged LEFT inguinal and LEFT pelvic lymph nodes. Reproductive: Large central fluid collection within distended uterus with marked thinning of myometrium. Collection measures 7.6 x 6.2 x 5.4 cm. This likely represents hydrometrocolpos, secondary to a large macrolobulated irregular soft tissue mass at the lower uterine segment and cervix either representing a cervical or endometrial neoplasm. Mass measures approximately 7.7 x 6.1 x 4.6 cm, appears to involve the posterior wall of the bladder, uterus, and distal LEFT ureter, at superior vagina. Scattered infiltrative changes of surrounding pelvic fat planes. Additional questionable volar nodule 13 mm diameter midline inferior to the pubic symphysis image 111. Other: Small amount of free fluid in pelvis. No free air. No hernia. Musculoskeletal: Large destructive bone lesion involving the anterior column and medial wall of the RIGHT acetabulum extending into RIGHT superior and inferior pubic rami as well as pubic body consistent with osseous metastasis. IMPRESSION: Large macrolobulated irregular heterogeneous enhancing soft tissue mass centered at the cervix/lower uterine segment measuring 7.6 x 6.2 x 5.4 cm in size most favoring a cervical neoplasm though endometrial neoplasm and vaginal neoplasm are not excluded.  Direct extension of tumor to involve the vagina and posterior bladder with obstruction of the the LEFT ureter and marked LEFT hydroureteronephrosis and impaired LEFT renal function, as well as cervical obstruction with hydrometrocolpos. Multiple hepatic metastases. Extensive osseous metastatic lesion involving the anterior column and medial wall of the RIGHT acetabulum as well as the RIGHT pubic body and RIGHT superior/inferior pubic rami. Pelvic adenopathy. BILATERAL pleural effusions with compressive atelectasis and question minimal infiltrate in lower lobes. Aortic Atherosclerosis (  ICD10-I70.0) and Emphysema (ICD10-J43.9). Electronically Signed   By: Lavonia Dana M.D.   On: 09/08/2017 18:09   Dg Hip Unilat With Pelvis 2-3 Views Right  Result Date: 09/08/2017 CLINICAL DATA:  Right hip pain for several days, no known injury, initial encounter EXAM: DG HIP (WITH OR WITHOUT PELVIS) 2-3V RIGHT COMPARISON:  None. FINDINGS: There is a moth-eaten appearance involving the superior and inferior pubic ramus on the right. Discontinuity particularly in superior pubic ramus is noted likely related to pathologic fracture. No other definitive lytic lesions are seen. 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 CT of the abdomen and pelvis is recommended for further evaluation. These results will be called to the ordering clinician or representative by the Radiologist Assistant, and communication documented in the PACS or zVision Dashboard. Electronically Signed   By: Inez Catalina M.D.   On: 09/08/2017 14:56    EKG:   Orders placed or performed during the hospital encounter of 09/04/17  . ED EKG  . ED EKG  . EKG 12-Lead  . EKG 12-Lead  . EKG 12-Lead  . EKG 12-Lead    ASSESSMENT AND PLAN:   Respiratory failure secondary to bilateral pneumonia: Continue IV antibiotics, oxygen, bronchodilators.  Patient is down to 2 L of oxygen and  saturation 99%  But she  Still e feels like she is short of breath.  Anxiety may be playing a role so started on Xanax.  CT chest did not show any, cancer.  : Sepsis pneumonia and COPD exacerbation: Improving with antibiotics.     #2. 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 Mike Gip from oncology, pending biopsy results to discuss treatment options, I requested palliative care consult meanwhile.  Patient alert, awake, oriented and knows that she has cancer and she told me that she is missing  Her boy friend. since admission she kept on telling about her boyfriend .Marland Kitchen Prognosis poor; palliative care consult.  Discussed with nurse,  Patient  All the records are reviewed and case discussed with Care Management/Social Workerr. Management plans discussed with the patient, family and they are in agreement.  CODE STATUS: Full code, discussed the CODE STATUS with her again and she wanted to be full code.  TOTAL TIME TAKING CARE OF THIS PATIENT: 41minutes.   POSSIBLE D/C IN 1-2 DAYS, DEPENDING ON CLINICAL CONDITION.   Epifanio Lesches M.D on 09/10/2017 at 1:15 PM  Between 7am to 6pm - Pager - [phone]  After 6pm go to [email or web address] - password [email or web address] [facility] [place] [facility]  Office  [phone]  CC: Primary care physician; Letta Median, MD   Note: This dictation was prepared with Dragon dictation along with smaller phrase technology. Any transcriptional errors that result from this process are unintentional.

## 2017-09-10 NOTE — Care Management (Signed)
Obtained patient's street address- [address]  CM nor patient has been able to reach her significant other Ann Held.  They have been a couple for over 20 years.  Neither have any living family members or friends. It has been mentioned that patient has a daughter.  Patient gave a daughter up for adoption years ago and has not heard from her since that time.  She has not heard from [place] since Monday. "He is not answering his phone."  CM gets recording "your call can not be completed as dialed."  Patient is tearful that something has happened to him.  There is no one to assist patient with care planning or support.  She has been dealt information over the last 24 hours that she has stage IV cervical cancer and the metastasis is significant.  This CM has encounter Edd Arbour on a previous hospitalization for himself and remember that he was very frail and not able to care for himself or able to make informed health care decisions.  Patient does not have Medicare A and is not able to tell CM why.  Unsure if she does not have a work history.  She has medicaid but again unsure what this covers.  Have reach out to pre service center.  Have requested a wellness visit by [employer] for [place].  Cm has concerns that patient does not have adequate health care coverage, no support from family, friends or community and wonder if she would be able to make informed health care decisions on her own.

## 2017-09-10 NOTE — Care Management (Signed)
Patient asks questions about finding Edd Arbour and does not seem to recall that CM has had several discussions about having the police going to their house for wellness check this morning.  She tells CM when I walk into the room- Can you do something about finding Ronnie.  I do not know who to ask".  Police have been to the apartment and no one is home.  A missing person report has been filed.  Reach out to attending regarding whether psych would be of benefit for capacity just to make sure patient is able to make informed decisions about her plan of care.

## 2017-09-10 NOTE — Progress Notes (Addendum)
PT Cancellation Note  Patient Details Name: Stacy Zhang MRN: [id number] DOB: [birth year]   Cancelled Treatment:     PT attempted three times this morning and pt refused, stating varying excuses.  PT will try again if time allows.   Roxanne Gates, PT, DPT 09/10/2017, 10:41 AM

## 2017-09-10 NOTE — Evaluation (Signed)
Physical Therapy Evaluation Patient Details Name: Stacy Zhang MRN: [id number] DOB: Aug 13, 1950 Today's Date: 09/10/2017   History of Present Illness  Pt admitted following c/o acute SOB and productive cough and being found to have hypoxemia.  PMH includes COPD.  Clinical Impression  Pt is a 67 year old female who lives in an apartment alone.  She was in bed upon PT arrival and able to perform bed mobility with use of bed rail.  Pt does not use an AD for mobility but PT recommended use of RW while pt is recovering.  Pt required VC's for hand placement during STS but was able to initiate and complete transfer without manual assistance.  Pt was also able to ambulate 30 ft in rm with RW and VC's for managemetn of RW.  Pt reported increased pain in R hip with ambulation which she states is the most limiting factor affecting her mobility at this time.  Pt presented with decreased UE and LE strength and reported N/T in bilateral feet which she states in not new.  Pt's vitals remained WNL during movement while on 2L O2.  Pt will benefit from continued skilled PT with focus on strength, tolerance to activity, hip pain, safe use of RW and functional mobility.    Follow Up Recommendations Home health PT    Equipment Recommendations  Rolling walker with 5" wheels    Recommendations for Other Services       Precautions / Restrictions        Mobility  Bed Mobility Overal bed mobility: Modified Independent             General bed mobility comments: Requires increased time to perform supine to sit.  Can scoot to EOB independently.  Transfers Overall transfer level: Needs assistance Equipment used: Rolling walker (2 wheeled) Transfers: Sit to/from Stand Sit to Stand: Supervision         General transfer comment: Pt able to initiate and complete STS.  PT provided VC's for proper use of RW due to pt having not used a RW previously.  Ambulation/Gait Ambulation/Gait assistance: Modified  independent (Device/Increase time) Ambulation Distance (Feet): 30 Feet Assistive device: Rolling walker (2 wheeled)     Gait velocity interpretation: at or above normal speed for age/gender General Gait Details: Pt presented with a step to gait pattern with decreased step length on L LE due to pt reported R hip pain.  Pt required VC's for management of RW around obstacles and regarding placement for fall prevention.  Stairs            Wheelchair Mobility    Modified Rankin (Stroke Patients Only)       Balance Overall balance assessment: Needs assistance Sitting-balance support: Single extremity supported       Standing balance support: Bilateral upper extremity supported                                 Pertinent Vitals/Pain Pain Assessment: Faces Faces Pain Scale: Hurts a little bit Pain Location: R hip pain Pain Intervention(s): Limited activity within patient's tolerance;Monitored during session    [place] expects to be discharged to:: Private residence Living Arrangements: Alone Available Help at Discharge: (Unsure) Type of Home: Apartment Home Access: Stairs to enter Entrance Stairs-Rails: Right Entrance Stairs-Number of Steps: 14 Home Layout: One level        Prior Function Level of Independence: Needs assistance   Gait /  Transfers Assistance Needed: Does not use an AD.  ADL's / Homemaking Assistance Needed: Does not drive but takes public transportation to get groceries.  Independent with bathing and dressing.        Hand Dominance   Dominant Hand: Right    Extremity/Trunk Assessment   Upper Extremity Assessment Upper Extremity Assessment: Generalized weakness    Lower Extremity Assessment Lower Extremity Assessment: Generalized weakness    Cervical / Trunk Assessment Cervical / Trunk Assessment: Kyphotic  Communication   Communication: No difficulties  Cognition Arousal/Alertness: Awake/alert Behavior  During Therapy: WFL for tasks assessed/performed Overall Cognitive Status: Within Functional Limits for tasks assessed                                        General Comments      Exercises     Assessment/Plan    PT Assessment Patient needs continued PT services  PT Problem List Decreased strength;Decreased mobility;Decreased activity tolerance;Decreased knowledge of use of DME;Cardiopulmonary status limiting activity;Pain       PT Treatment Interventions DME instruction;Therapeutic activities;Gait training;Therapeutic exercise;Patient/family education;Stair training;Balance training;Functional mobility training;Neuromuscular re-education    PT Goals (Current goals can be found in the Care Plan section)  Acute Rehab PT Goals Patient Stated Goal: To return home and resume normal level of independence PT Goal Formulation: With patient Time For Goal Achievement: 09/24/17 Potential to Achieve Goals: Good    Frequency Min 2X/week   Barriers to discharge        Co-evaluation               AM-PAC PT "6 Clicks" Daily Activity  Outcome Measure Difficulty turning over in bed (including adjusting bedclothes, sheets and blankets)?: A Little Difficulty moving from lying on back to sitting on the side of the bed? : A Little Difficulty sitting down on and standing up from a chair with arms (e.g., wheelchair, bedside commode, etc,.)?: A Little Help needed moving to and from a bed to chair (including a wheelchair)?: A Little Help needed walking in hospital room?: A Lot Help needed climbing 3-5 steps with a railing? : A Lot 6 Click Score: 16    End of Session Equipment Utilized During Treatment: Gait belt;Oxygen Activity Tolerance: Patient limited by fatigue Patient left: in chair;with call bell/phone within reach;with chair alarm set   PT Visit Diagnosis: Unsteadiness on feet (R26.81);Muscle weakness (generalized) (M62.81);Pain Pain - Right/Left: Left Pain -  part of body: Hip    Time: 1120-1200 PT Time Calculation (min) (ACUTE ONLY): 40 min   Charges:   PT Evaluation $PT Eval Low Complexity: 1 Low PT Treatments $Gait Training: 8-22 mins   PT G Codes:   PT G-Codes **NOT FOR INPATIENT CLASS** Functional Assessment Tool Used: AM-PAC 6 Clicks Basic Mobility    Roxanne Gates, PT, DPT   Roxanne Gates 09/10/2017, 12:20 PM

## 2017-09-10 NOTE — Progress Notes (Signed)
[facility] Hematology/Oncology Progress Note  Date of admission: 09/04/2017  Hospital day:  09/10/2017  Chief Complaint: Stacy Zhang is a 67 y.o. female with COPD who was admitted through the emergency room with bilateral pneumonia.  Subjective: She feels the same.  Wants to go home and pay her bills and take care of her cats.  Social History: The patient is alone today.  Allergies: No Known Allergies  Scheduled Medications: . ALPRAZolam  0.5 mg Oral BID  . azithromycin  500 mg Oral Daily  . docusate sodium  100 mg Oral BID  . feeding supplement (ENSURE ENLIVE)  237 mL Oral TID BM  . fluticasone  1 spray Each Nare Daily  . guaiFENesin  600 mg Oral BID  . Influenza vac split quadrivalent PF  0.5 mL Intramuscular Tomorrow-1000  . ipratropium-albuterol  3 mL Nebulization Q6H  . methylPREDNISolone (SOLU-MEDROL) injection  60 mg Intravenous Q24H  . multivitamin with minerals  1 tablet Oral Daily  . pantoprazole (PROTONIX) IV  40 mg Intravenous Q12H  . sodium chloride flush  3 mL Intravenous Q12H    Review of Systems: GENERAL:  Fatigued.  Minimally active.  No fevers or sweats.  Weight loss of 25 pounds in 2 years. PERFORMANCE STATUS (ECOG):  2 HEENT:  No visual changes, sore throat, mouth sores or tenderness. Lungs: Shortness of breath.  Cough.  No hemoptysis. Cardiac:  No chest pain, palpitations, orthopnea, or PND. GI:  Poor appetite.  Constipation.  No nausea, vomiting, diarrhea, melena or hematochezia. GU:  Difficulty voiding.  Foley catheter in place.  No urgency, frequency, dysuria, or hematuria.  Bleeding on/off x 2 1/2 years. Musculoskeletal:  Right hip pain radiates to back then left side.  No muscle tenderness. Extremities:  No pain or swelling. Skin:  No rashes or skin changes. Neuro:  Difficulty standing secondary to generalized weakness.  No headache, numbness or weakness, balance or coordination issues. Endocrine:  No diabetes, thyroid issues, hot  flashes or night sweats. Psych:  Upset by all of the information she is hearing. Pain:  Denies pelvic pain. Review of systems:  All other systems reviewed and found to be negative.  Physical Exam: Blood pressure 133/64, pulse (!) 115, temperature 98.5 F (36.9 C), temperature source Oral, resp. rate 19, height 5' 2"  (1.575 m), weight 87 lb 4.8 oz (39.6 kg), SpO2 94 %.  GENERAL:  Chronically ill appearing cachectic woman lying comfortably on the medical unit in no acute distress. MENTAL STATUS:  Alert and oriented to person, place and time. HEAD:  Shoulder length brown hair.  Temporal wasting.  Normocephalic, atraumatic, face symmetric, no Cushingoid features. EYES:  Brown eyes.  Pupils equal round and reactive to light and accomodation.  No conjunctivitis or scleral icterus. ENT:  Haena in place.  Edentulous.  Mucous membranes moist.  RESPIRATORY:  Poor respiratory excursion.  Decreased breath sounds at the bases.  Clear to auscultation without rales, wheezes or rhonchi. CARDIOVASCULAR:  Regular rate and rhythm without murmur, rub or gallop. ABDOMEN:  Minimal suprapubic discomfort.  Soft, non-tender, with active bowel sounds, and no hepatosplenomegaly.  No masses. GU:  Foley catheter in place. SKIN:  No rashes, ulcers or lesions. EXTREMITIES: Thin.  No edema, no skin discoloration or tenderness.  No palpable cords. LYMPH NODES: No palpable cervical, supraclavicular, axillary or inguinal adenopathy  NEUROLOGICAL: Unremarkable. PSYCH:  Appropriate.   Results for orders placed or performed during the hospital encounter of 09/04/17 (from the past 48 hour(s))  Glucose, capillary     Status: Abnormal   Collection Time: 09/09/17  7:39 AM  Result Value Ref Range   Glucose-Capillary 108 (H) 65 - 99 mg/dL  Glucose, capillary     Status: None   Collection Time: 09/09/17 11:48 AM  Result Value Ref Range   Glucose-Capillary 90 65 - 99 mg/dL  Surgical pathology     Status: None   Collection Time:  09/09/17  3:29 PM  Result Value Ref Range   SURGICAL PATHOLOGY      Surgical Pathology CASE: [id number] PATIENT: Stacy Zhang Surgical Pathology Report     SPECIMEN SUBMITTED: A. Cervix; bx  CLINICAL HISTORY: Large cervical mass  PRE-OPERATIVE DIAGNOSIS: Cervical cancer  POST-OPERATIVE DIAGNOSIS: None provided.     DIAGNOSIS: A. CERVIX; BIOPSY: - INVASIVE SQUAMOUS CELL CARCINOMA.  Comment: Non-keratinizing squamous cell carcinoma is present, with stromal invasion. Focal areas are highly suspicious for lymphovascular invasion. There is extensive necrosis. One fragment appears to be surfaced by high-grade squamous intraepithelial lesion (CIN 3).  Immunohistochemistry (IHC) for DNA mismatch repair (MMR) has been requested and the result will be reported in an addendum. PD-L1 IHC will also be performed, and a second addendum will be issued with the result.  The diagnosis was called to Dr. Tonette Zhang nurse Stacy Zhang on 09/10/2017 at 11:17 AM. Read-back was performed. The case was also discussed with Dr. Mike Zhang and  she is in agreement with the request for MMR and PD-L1 assessment.   GROSS DESCRIPTION: A. Labeled: cervical biopsy Tissue fragment(s): multiple Size: aggregate, 2.6 x 1.2 x 0.1 cm Description: in formalin, blood clot and pink fragments of tissue  Entirely submitted in 1 cassette(s).      Final Diagnosis performed by Stacy Lemma, MD.  Electronically signed 09/10/2017 1:47:02PM    The electronic signature indicates that the named Attending Pathologist has evaluated the specimen  Technical component performed at [facility], [address], [place], [place] [address] Lab: [phone] Dir: Stacy Farmer, MD, MMM  Professional component performed at [facility], [facility], [place], [place], [place] [address] Lab: [phone] Dir: Stacy Nims. Rubinas, MD    Glucose, capillary     Status: Abnormal   Collection Time:  09/10/17  7:51 AM  Result Value Ref Range   Glucose-Capillary 104 (H) 65 - 99 mg/dL   No results found.  Assessment:  Stacy Zhang is a 67 y.o. female with metastatic cervical cancer s/p cervical biopsy on 09/09/2017.  Pathology confirmed invasive squamous cell cancer.  She presented with a fatigue, shortness of breath, a 25 pound weight loss, and intermittent vaginal bleeding for 2 1/2 years.  Chest, abdomen, and pelvic CT on 09/08/2017 revealed a large macrolobulated irregular heterogeneous enhancing soft tissue mass centered at the cervix/lower uterine segment measuring 7.6 x 6.2 x 5.4 cm in size most favoring a cervical neoplasm though endometrial neoplasm and vaginal neoplasm were not excluded.  There was direct extension of tumor to involve the vagina and posterior bladder with obstruction of the the LEFT ureter and marked LEFT hydroureteronephrosis and impaired LEFT renal function, as well as cervical obstruction with hydrometrocolpos.  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  There was extensive osseous metastatic lesion involving the anterior column and medial wall of the RIGHT acetabulum as well as the RIGHT pubic body and RIGHT superior/inferior pubic rami.  There was pelvic adenopathy.  There were BILATERAL pleural effusions with compressive atelectasis and question minimal infiltrate in lower lobes.  Symptomatically, she is fatigued.  Exam reveals cachexia,  decreased breath sounds at the bases, and suprapubic discomfort.  Plan:   1.  Oncology:  Discussed results of cervical biopsy with patient.  She has metastatic cervical cancer with significant bulk disease and genalized debilitation.     We discussed treatment is palliative.  She declined looking at images.  She is not a surgical candidate.  We discussed palliative chemotherapy (carboplatin + Taxol and +/- Avastin).  Response rates 35-40%.  Single agent chemotherapy response rates 19-20%.  She may be more appropriate for weekly  therapy to assess tolerance given her poor performance status.  We discussed palliative radiation (discussed at tumor board).  She was interested in taking to Dr. Baruch Gouty.  We discussed supportive care/Hospice.  She is considering all of her options, but is leaning toward initial radiation and possible chemotherapy in the future.  Multiple questions were asked and answered.  We also discussed the possibility of second line pembrolizumab (response rates 17%).  Mismatch repair/microsatellite instability and PDL-1 to be sent on the tumor.  The patient will be seen by gynecology oncology next week (09/16/2017).  2.  Urology:  Left hydronephrosis long standing with minimal renal parenchyma not felt to benefit from nephrostomy tube placement.  Appreciate urology consult.  3.  Disposition:  Difficult social situation.  Patient has no support or medical power of attorney.  She is estranged from her daughter. Her boyfriend of 20 years recently left the house.  She is unable to care for herself secondary to generalized weakness.  She repeatedly stated that she needs to get home to take care of her cats and bills.  We discussed transportation to and from the clinic with the van.  We readdressed code status.  She is leaning toward DNR/DNI.   Lequita Asal, MD  09/10/2017, 8:31 PM

## 2017-09-11 ENCOUNTER — Institutional Professional Consult (permissible substitution): Payer: Medicare Other | Admitting: Radiation Oncology

## 2017-09-11 DIAGNOSIS — M25551 Pain in right hip: Secondary | ICD-10-CM | POA: Diagnosis not present

## 2017-09-11 DIAGNOSIS — Z7189 Other specified counseling: Secondary | ICD-10-CM | POA: Diagnosis not present

## 2017-09-11 DIAGNOSIS — J189 Pneumonia, unspecified organism: Secondary | ICD-10-CM | POA: Diagnosis not present

## 2017-09-11 DIAGNOSIS — J9601 Acute respiratory failure with hypoxia: Secondary | ICD-10-CM | POA: Diagnosis not present

## 2017-09-11 DIAGNOSIS — Z515 Encounter for palliative care: Secondary | ICD-10-CM | POA: Diagnosis not present

## 2017-09-11 LAB — GLUCOSE, CAPILLARY: Glucose-Capillary: 87 mg/dL (ref 65–99)

## 2017-09-11 NOTE — Care Management Important Message (Signed)
Important Message  Patient Details  Name: Stacy Zhang MRN: [id number] Date of Birth: [birth year]   Medicare Important Message Given:  Yes  Signed IM notice given   Katrina Stack, RN 09/11/2017, 4:47 PM

## 2017-09-11 NOTE — Progress Notes (Signed)
Pharmacy Antibiotic Note  Stacy Zhang is a 67 y.o. female admitted on 09/04/2017 with sepsis s/t CAP pneumonia.  Pharmacy  consulted for azithromycin/ceftriaxone dosing.  Plan: Will continue CTX 1 g IV and Azithromycin 500 mg daily.  Height: 5\' 2"  (157.5 cm) Weight: 87 lb 3.2 oz (39.6 kg) IBW/kg (Calculated) : 50.1  Temp (24hrs), Avg:98.3 F (36.8 C), Min:97.9 F (36.6 C), Max:98.6 F (37 C)  Recent Labs  Lab 09/04/17 2222 09/04/17 2355 09/05/17 0323 09/05/17 0721 09/06/17 1240 09/08/17 0413  WBC 37.5*  --  28.1*  --  22.8* 11.9*  CREATININE 1.08*  --  1.10*  --  0.85 0.85  LATICACIDVEN  --  2.2* 3.2* 2.6*  --   --     Estimated Creatinine Clearance: 40.7 mL/min (by C-G formula based on SCr of 0.85 mg/dL).    No Known Allergies  Thank you for allowing pharmacy to be a part of this patient's care.  Larene Beach, PharmD  Clinical Pharmacist 09/11/2017

## 2017-09-11 NOTE — Consult Note (Signed)
Consultation Note Date: 09/11/2017   Patient Name: RAQUELLE PIETRO  DOB: 09/06/1950  MRN: [id number]  Age / Sex: 67 y.o., female  PCP: Letta Median, MD Referring Physician: Epifanio Lesches, MD  Reason for Consultation: Establishing goals of care  HPI/Patient Profile: Luzelena Heeg  is a 67 y.o. female with a known history of COPD, not on home oxygen. She presented to the emergency room via EMS for acute onset of shortness of breath and productive cough started in the past 24-48 hours. She was treated for PNA.   During her hospitalization, she was noted to have urinary retention and right hip pain. The CT revealed cervical cancer with metastasis.      Clinical Assessment and Goals of Care: Mrs. Gloriann Loan is resting in bed. Initial meeting and rapport building.  She states she is very anxious about her boyfriend, that she has not spoken with him since she has been in the hospital. Was advised previously that law enforcement was called to find him.  Baleigh states that he has been found and he has been staying at a friend.  She states that it is common for him to disappear for days at a time to drink.  All she tells me that they have lived together for the past 20 years.  And that she has no other family. She states she would feel so much better if she could talk to him, but he has turned his cell phone off.   Yong explains what the medical teams have told her, and tells me that she is afraid to die because she is afraid God will send her to hell. Support offered. She is determining what course of action to take.      SUMMARY OF RECOMMENDATIONS    Will revisit tomorrow for [place].   Code Status/Advance Care Planning:  Full code    Symptom Management:   No complaints.   Palliative Prophylaxis:   Oral Care   Prognosis:   Unable to determine  Discharge Planning: To Be Determined      Primary  Diagnoses: Present on Admission: . Sepsis ([place])   I have reviewed the medical record, interviewed the patient and family, and examined the patient. The following aspects are pertinent.  Past Medical History:  Diagnosis Date  . COPD (chronic obstructive pulmonary disease) (HCC)    Social History   Socioeconomic History  . Marital status: Single    Spouse name: None  . Number of children: None  . Years of education: None  . Highest education level: None  Social Needs  . Financial resource strain: None  . Food insecurity - worry: None  . Food insecurity - inability: None  . Transportation needs - medical: None  . Transportation needs - non-medical: None  Occupational History  . None  Tobacco Use  . Smoking status: Former Research scientist (life sciences)  . Smokeless tobacco: Never Used  Substance and Sexual Activity  . Alcohol use: No  . Drug use: None  . Sexual activity: None  Other Topics  Concern  . None  Social History Narrative  . None   History reviewed. No pertinent family history. Scheduled Meds: . ALPRAZolam  0.5 mg Oral BID  . azithromycin  500 mg Oral Daily  . docusate sodium  100 mg Oral BID  . feeding supplement (ENSURE ENLIVE)  237 mL Oral TID BM  . fluticasone  1 spray Each Nare Daily  . guaiFENesin  600 mg Oral BID  . Influenza vac split quadrivalent PF  0.5 mL Intramuscular Tomorrow-1000  . ipratropium-albuterol  3 mL Nebulization Q6H  . methylPREDNISolone (SOLU-MEDROL) injection  60 mg Intravenous Q24H  . multivitamin with minerals  1 tablet Oral Daily  . pantoprazole (PROTONIX) IV  40 mg Intravenous Q12H  . sodium chloride flush  3 mL Intravenous Q12H   Continuous Infusions: . cefTRIAXone (ROCEPHIN)  IV Stopped (09/10/17 1109)   PRN Meds:.acetaminophen **OR** acetaminophen, bisacodyl, HYDROcodone-acetaminophen, ondansetron **OR** ondansetron (ZOFRAN) IV, traZODone Medications Prior to Admission:  Prior to Admission medications   Medication Sig Start Date End Date  Taking? Authorizing Provider  COMBIVENT RESPIMAT 20-100 MCG/ACT AERS respimat Inhale 2 puffs into the lungs 2 (two) times daily. 08/10/17  Yes [provider]  fluticasone (FLONASE) 50 MCG/ACT nasal spray Place 1 spray into both nostrils daily. 08/10/17  Yes [provider]  sulfamethoxazole-trimethoprim (BACTRIM DS,SEPTRA DS) 800-160 MG tablet Take 1 tablet by mouth 2 (two) times daily. Patient not taking: Reported on 09/04/2017 05/15/16   Hagler, Jami L, PA-C  valACYclovir (VALTREX) 1000 MG tablet Take 1 tablet (1,000 mg total) by mouth 3 (three) times daily. Patient not taking: Reported on 09/04/2017 05/15/16   Hagler, Jami L, PA-C   No Known Allergies Review of Systems  Respiratory: Positive for cough.   Musculoskeletal:       Joint pain    Physical Exam  Constitutional: No distress.  Pulmonary/Chest: Effort normal.  Neurological: She is alert.  Oriented  Skin: Skin is warm and dry.    Vital Signs: BP 137/67 (BP Location: Left Arm)   Pulse (!) 108   Temp 98.5 F (36.9 C) (Oral)   Resp 18   Ht 5\' 2"  (1.575 m)   Wt 39.6 kg (87 lb 3.2 oz)   SpO2 96%   BMI 15.95 kg/m  Pain Assessment: 0-10 POSS *See Group Information*: S-Acceptable,Sleep, easy to arouse Pain Score: Asleep   SpO2: SpO2: 96 % O2 Device:SpO2: 96 % O2 Flow Rate: .O2 Flow Rate (L/min): 2 L/min  IO: Intake/output summary:   Intake/Output Summary (Last 24 hours) at 09/11/2017 0834 Last data filed at 09/11/2017 0348 Gross per 24 hour  Intake 240 ml  Output 600 ml  Net -360 ml    LBM: Last BM Date: 09/09/17 Baseline Weight: Weight: 40.8 kg (90 lb) Most recent weight: Weight: 39.6 kg (87 lb 3.2 oz)     Palliative Assessment/Data: 50%     Time In: 4:55 Time Out: 5:30 Time Total: 35 min Greater than 50%  of this time was spent counseling and coordinating care related to the above assessment and plan.  Signed by: Asencion Gowda, NP   Please contact Palliative Medicine Team phone at  [phone] for questions and concerns.  For individual provider: See Shea Evans

## 2017-09-11 NOTE — Progress Notes (Addendum)
Daily Progress Note   Patient Name: Stacy Zhang       Date: 09/11/2017 DOB: [birth year]  Age: 67 y.o. MRN#: [id number] Attending Physician: Epifanio Lesches, MD Primary Care Physician: Letta Median, MD Admit Date: 09/04/2017  Reason for Consultation/Follow-up: Establishing goals of care  Subjective: Stacy Zhang is sitting in bed with breakfast in front of her. She is extremely anxious her boyfriend will not speak with her. She is extremely anxious about going to hell when she dies. Called boyfriend from her phone twice and someone picked up and a few seconds later hung up. After the second attempt the phone was cut off. She has no family support and due to anxiety is not able to make decisions at this time about goals of care. She states physical pain is not the problem, her problem is her heart. We attempted to discuss her diagnosis and she states "if I could just talk to him one time it would give me peace." Support offered.   Chaplain in to see patient.    Length of Stay: 6  Current Medications: Scheduled Meds:  . ALPRAZolam  0.5 mg Oral BID  . azithromycin  500 mg Oral Daily  . docusate sodium  100 mg Oral BID  . feeding supplement (ENSURE ENLIVE)  237 mL Oral TID BM  . fluticasone  1 spray Each Nare Daily  . guaiFENesin  600 mg Oral BID  . Influenza vac split quadrivalent PF  0.5 mL Intramuscular Tomorrow-1000  . ipratropium-albuterol  3 mL Nebulization Q6H  . methylPREDNISolone (SOLU-MEDROL) injection  60 mg Intravenous Q24H  . multivitamin with minerals  1 tablet Oral Daily  . pantoprazole (PROTONIX) IV  40 mg Intravenous Q12H  . sodium chloride flush  3 mL Intravenous Q12H    Continuous Infusions: . cefTRIAXone (ROCEPHIN)  IV 1 g (09/11/17 0941)    PRN  Meds: acetaminophen **OR** acetaminophen, bisacodyl, HYDROcodone-acetaminophen, ondansetron **OR** ondansetron (ZOFRAN) IV, traZODone  Physical Exam  Constitutional: She appears distressed.  Pulmonary/Chest: Effort normal.  Neurological: She is alert.  Oriented  Skin: Skin is warm and dry.  Psychiatric: Her mood appears anxious.            Vital Signs: BP 137/67 (BP Location: Left Arm)   Pulse (!) 108   Temp 98.5 F (36.9 C) (Oral)  Resp 18   Ht 5\' 2"  (1.575 m)   Wt 39.6 kg (87 lb 3.2 oz)   SpO2 95%   BMI 15.95 kg/m  SpO2: SpO2: 95 % O2 Device: O2 Device: Nasal Cannula O2 Flow Rate: O2 Flow Rate (L/min): 2 L/min  Intake/output summary:   Intake/Output Summary (Last 24 hours) at 09/11/2017 1006 Last data filed at 09/11/2017 0348 Gross per 24 hour  Intake 240 ml  Output 350 ml  Net -110 ml   LBM: Last BM Date: 09/09/17 Baseline Weight: Weight: 40.8 kg (90 lb) Most recent weight: Weight: 39.6 kg (87 lb 3.2 oz)       Palliative Assessment/Data: 50%    Flowsheet Rows     Most Recent Value  Intake Tab  Referral Department  Hospitalist  Unit at Time of Referral  Med/Surg Unit  Palliative Care Primary Diagnosis  Pulmonary  Date Notified  09/10/17  Palliative Care Type  New Palliative care  Reason for referral  Clarify Goals of Care  Date of Admission  09/04/17  Date first seen by Palliative Care  09/10/17  # of days Palliative referral response time  0 Day(s)  # of days IP prior to Palliative referral  6  Clinical Assessment  Psychosocial & Spiritual Assessment  Palliative Care Outcomes      Patient Active Problem List   Diagnosis Date Noted  . Protein-calorie malnutrition, severe 09/08/2017  . Gross hematuria   . Urinary retention   . Sepsis ([place]) 09/05/2017    Palliative Care Assessment & Plan   Patient Profile: Stacy Zhang y.o.femalewith a known history of COPD,not on home oxygen. She presented to the emergency room via EMS for acute onset  of shortness of breath and productive cough started in the past 24-48 hours. She was treated for PNA.   During her hospitalization, she was noted to have urinary retention and right hip pain. The CT revealed cervical cancer with metastasis.    Assessment/Recommendations/Plan:  Will need continued conversation about discharge plan.   Per patient, low level of physical discomfort currently. ? level of physical discomfort may be controlled with medication.   Continue conversations about hospice care.     Code Status:    Code Status Orders  (From admission, onward)        Start     Ordered   09/05/17 0301  Full code  Continuous     09/05/17 0300    Code Status History    Date Active Date Inactive Code Status Order ID Comments User Context   This patient has a current code status but no historical code status.       Prognosis:  Poor.   Discharge Planning:  To Be Determined No family and no assistance at home.   Care plan was discussed with CM.   Thank you for allowing the Palliative Medicine Team to assist in the care of this patient.   Time In: 9:20 Time Out: 10:40 Total Time 70 min Prolonged Time Billed  yes      Greater than 50%  of this time was spent counseling and coordinating care related to the above assessment and plan.  Asencion Gowda, NP  Please contact Palliative Medicine Team phone at [phone] for questions and concerns.

## 2017-09-11 NOTE — Care Management (Signed)
Patient's 20 yr companion has been found- on a drinking binges. Discussed concerns that patient has absolutely no caregiver support in the home and she is not able to meet her care needs.  Uses cab for transpportation. Discussed possible need for medicaid long term placement.  At present, a treatment plan has not been established. Patient also verbalizing she is afraid when she dies she is going to hell even though she had been saved. reached out to pastoral care to identify member of that care team that can have a very serious discussion with patient about her salvation.  Updated CSW that may need to search for long term medicaid skilled nursing bed.  Patient does not have medicare A coverage

## 2017-09-11 NOTE — Progress Notes (Signed)
   09/11/17 1005  Clinical Encounter Type  Visited With Patient  Visit Type Initial  Referral From Physician;Social work  Consult/Referral To Chaplain  Spiritual Encounters  Spiritual Needs Prayer;Emotional;Grief support   CH meet with SW and NP to discuss PT who was recently diagnosed with Cancer. PT has a lees than desirable home life. PT's partner of 20+ years is an active alcoholic and has been unavailable for support. CH is working with care team to help find proper long term care. [place] also recommended some sort of counseling.

## 2017-09-11 NOTE — Consult Note (Signed)
NEW PATIENT EVALUATION  Name: Stacy Zhang  MRN: [id number]  Date:   09/04/2017     DOB: 11-Jul-1951   This 67 y.o. female patient presents to the clinic for initial evaluation of stage IV cervical cancer.  REFERRING PHYSICIAN: No ref. provider found  CHIEF COMPLAINT:  Chief Complaint  Patient presents with  . Shortness of Breath  . Respiratory Distress    DIAGNOSIS: The primary encounter diagnosis was Acute respiratory failure with hypoxia ([place]). Diagnoses of Multifocal pneumonia, Pneumonia, and Hip pain, right were also pertinent to this visit.   PREVIOUS INVESTIGATIONS:  CT scans reviewed Pathology reports reviewed Clinical notes reviewed Case presented at weekly tumor conference  HPI: Patient is a 67 year old female with marked comorbidities including COPD bilateral pneumonia who presented with vaginal bleeding. She is also had a 25 pound weight loss and significant right hip pain. Workup including CT scan showing large macrolobulated irregular heterogeneous mass centered at the cervix and lower uterine segment measuring 7.6 x 6.2 cm favoring cervical neoplasm. There was direct extension of tumor to involve the vagina and posterior bladder with hydronephrosis and impaired left renal function. There was also large destruction lesion involving the anterior column and medial wall of the right acetabulum extending into the right superior inferior pubic rami as well as pubic body consistent with metastatic disease. Case was presented at our weekly tumor conference and consideration for chemotherapy possible carb Taxol plus Avastin along with palliative radiation therapy was discussed. Patient is seen today for consideration of palliative radiation therapy based on her right hip pain and symptoms associated with this large pelvic mass. She seen today in her hospital bed. She is frail and and is seen today at bedside  PLANNED TREATMENT REGIMEN: Palliative radiation therapy to her pelvis and  bony metastasis  PAST MEDICAL HISTORY:  has a past medical history of COPD (chronic obstructive pulmonary disease) ([place]).    PAST SURGICAL HISTORY: History reviewed. No pertinent surgical history.  FAMILY HISTORY: family history is not on file.  SOCIAL HISTORY:  reports that she has quit smoking. she has never used smokeless tobacco. She reports that she does not drink alcohol.  ALLERGIES: Patient has no known allergies.  MEDICATIONS:  Current Facility-Administered Medications  Medication Dose Route Frequency Provider Last Rate Last Dose  . acetaminophen (TYLENOL) tablet 650 mg  650 mg Oral Q6H PRN Amelia Jo, MD       Or  . acetaminophen (TYLENOL) suppository 650 mg  650 mg Rectal Q6H PRN Amelia Jo, MD      . ALPRAZolam Duanne Moron) tablet 0.5 mg  0.5 mg Oral BID Epifanio Lesches, MD   0.5 mg at 09/11/17 0940  . azithromycin (ZITHROMAX) tablet 500 mg  500 mg Oral Daily Merlyn Lot, MD   500 mg at 09/10/17 2105  . bisacodyl (DULCOLAX) EC tablet 5 mg  5 mg Oral Daily PRN Amelia Jo, MD      . cefTRIAXone (ROCEPHIN) 1 g in dextrose 5 % 50 mL IVPB  1 g Intravenous Q24H Merlyn Lot, MD 100 mL/hr at 09/11/17 0941 1 g at 09/11/17 0941  . docusate sodium (COLACE) capsule 100 mg  100 mg Oral BID Amelia Jo, MD   100 mg at 09/11/17 0940  . feeding supplement (ENSURE ENLIVE) (ENSURE ENLIVE) liquid 237 mL  237 mL Oral TID BM Epifanio Lesches, MD   237 mL at 09/10/17 2104  . fluticasone (FLONASE) 50 MCG/ACT nasal spray 1 spray  1 spray Each Nare Daily Duane Boston,  Levada Dy, MD   1 spray at 09/11/17 0940  . guaiFENesin (MUCINEX) 12 hr tablet 600 mg  600 mg Oral BID Epifanio Lesches, MD   600 mg at 09/11/17 0940  . HYDROcodone-acetaminophen (NORCO/VICODIN) 5-325 MG per tablet 1-2 tablet  1-2 tablet Oral Q4H PRN Amelia Jo, MD   1 tablet at 09/10/17 2105  . Influenza vac split quadrivalent PF (FLUZONE HIGH-DOSE) injection 0.5 mL  0.5 mL Intramuscular Tomorrow-1000 Amelia Jo, MD   Stopped at 09/06/17 [phone]  . ipratropium-albuterol (DUONEB) 0.5-2.5 (3) MG/3ML nebulizer solution 3 mL  3 mL Nebulization Q6H Amelia Jo, MD   3 mL at 09/11/17 0858  . methylPREDNISolone sodium succinate (SOLU-MEDROL) 125 mg/2 mL injection 60 mg  60 mg Intravenous Q24H Epifanio Lesches, MD   60 mg at 09/10/17 1343  . multivitamin with minerals tablet 1 tablet  1 tablet Oral Daily Epifanio Lesches, MD   1 tablet at 09/11/17 0940  . ondansetron (ZOFRAN) tablet 4 mg  4 mg Oral Q6H PRN Amelia Jo, MD       Or  . ondansetron [facility]) injection 4 mg  4 mg Intravenous Q6H PRN Amelia Jo, MD      . pantoprazole (PROTONIX) injection 40 mg  40 mg Intravenous Q12H Epifanio Lesches, MD   40 mg at 09/11/17 0940  . sodium chloride flush (NS) 0.9 % injection 3 mL  3 mL Intravenous Q12H Epifanio Lesches, MD   3 mL at 09/11/17 0941  . traZODone (DESYREL) tablet 25 mg  25 mg Oral QHS PRN Amelia Jo, MD        ECOG PERFORMANCE STATUS:  3 - Symptomatic, >50% confined to bed  REVIEW OF SYSTEMS: Patient has been having some vaginal bleeding as well as significant right hip pain Patient denies any weight loss, fatigue, weakness, fever, chills or night sweats. Patient denies any loss of vision, blurred vision. Patient denies any ringing  of the ears or hearing loss. No irregular heartbeat. Patient denies heart murmur or history of fainting. Patient denies any chest pain or pain radiating to her upper extremities. Patient denies any shortness of breath, difficulty breathing at night, cough or hemoptysis. Patient denies any swelling in the lower legs. Patient denies any nausea vomiting, vomiting of blood, or coffee ground material in the vomitus. Patient denies any stomach pain. Patient states has had normal bowel movements no significant constipation or diarrhea. Patient denies any dysuria, hematuria or significant nocturia. Patient denies any problems walking, swelling in the joints or  loss of balance. Patient denies any skin changes, loss of hair or loss of weight. Patient denies any excessive worrying or anxiety or significant depression. Patient denies any problems with insomnia. Patient denies excessive thirst, polyuria, polydipsia. Patient denies any swollen glands, patient denies easy bruising or easy bleeding. Patient denies any recent infections, allergies or URI. Patient "s visual fields have not changed significantly in recent time.    PHYSICAL EXAM: BP 137/67 (BP Location: Left Arm)   Pulse (!) 108   Temp 98.5 F (36.9 C) (Oral)   Resp 18   Ht 5\' 2"  (1.575 m)   Wt 87 lb 3.2 oz (39.6 kg)   SpO2 95%   BMI 15.95 kg/m  Frail-appearing anorexic female in NAD. Range of motion of her right lower extremity shows motor and sensory levels are equal and symmetric does have pain on range of motion of her right hip. Proprioception is intact. Well-developed well-nourished patient in NAD. HEENT reveals PERLA, EOMI, discs not  visualized.  Oral cavity is clear. No oral mucosal lesions are identified. Neck is clear without evidence of cervical or supraclavicular adenopathy. Lungs are clear to A&P. Cardiac examination is essentially unremarkable with regular rate and rhythm without murmur rub or thrill. Abdomen is benign with no organomegaly or masses noted. Motor sensory and DTR levels are equal and symmetric in the upper and lower extremities. Cranial nerves II through XII are grossly intact. Proprioception is intact. No peripheral adenopathy or edema is identified. No motor or sensory levels are noted. Crude visual fields are within normal range.  LABORATORY DATA: Pathology reports reviewed    RADIOLOGY RESULTS: CT scans reviewed   IMPRESSION: Stage IV cervical cancer with significant symptoms including right hip pain vaginal bleeding in 67 year old female  PLAN: Present time I have recommended palliative radiation therapy to her pelvis including her bone metastasis in the  right acetabulum and symphysis pubic region. Would plan on delivering 4000 cGy in 20 fractions This can be performed along with chemotherapy and I discussed the case personally with medical oncology.  I have personally set her up for CT simulation early next week and will start treatments next week. Risks and benefits of treatment including possible increased lower urinary tract symptoms diarrhea fatigue skin reaction alteration of blood counts all were discussed in detail with the patient. She seems to comprehend my treatment plan well.  I would like to take this opportunity to thank you for allowing me to participate in the care of your patient.Noreene Filbert, MD

## 2017-09-11 NOTE — Progress Notes (Signed)
[place] [facility] [place] NAME: Stacy Zhang    MR#:  [id number]  DATE OF BIRTH:  [birth year]  Pt feels weak,no overnight events.very  Anxious.  CHIEF COMPLAINT:   Chief Complaint  Patient presents with  . Shortness of Breath  . Respiratory Distress    REVIEW OF SYSTEMS:   Review of Systems  Constitutional: Positive for weight loss. Negative for chills and fever.  HENT: Negative for hearing loss.   Eyes: Negative for blurred vision, double vision and photophobia.  Respiratory: Positive for cough. Negative for hemoptysis and shortness of breath.   Cardiovascular: Negative for palpitations, orthopnea and leg swelling.  Gastrointestinal: Negative for abdominal pain, diarrhea and vomiting.  Genitourinary: Negative for dysuria and urgency.  Musculoskeletal: Negative for myalgias and neck pain.  Skin: Negative for rash.  Neurological: Positive for weakness. Negative for dizziness, focal weakness, seizures and headaches.  Psychiatric/Behavioral: Negative for memory loss. The patient does not have insomnia.       DRUG ALLERGIES:  No Known Allergies  VITALS:  Blood pressure 117/63, pulse (!) 115, temperature 98.3 F (36.8 C), temperature source Oral, resp. rate 18, height 5\' 2"  (1.575 m), weight 39.6 kg (87 lb 3.2 oz), SpO2 95 %.  PHYSICAL EXAMINATION:  GENERAL:  67 y.o.-year-old patient lying in the bed with no acute distress.  Patient is cachectic/has mouth ulcers. EYES: Pupils equal, round, reactive to light and accommodation. No scleral icterus. Extraocular muscles intact.  HEENT: Head atraumatic, normocephalic. Oropharynx and nasopharynx clear.  NECK:  Supple, no jugular venous distention. No thyroid enlargement, no tenderness.  LUNGS: No wheezing today but diminished breath sounds bilaterally. CARDIOVASCULAR: S1, S2 normal. No murmurs, rubs, or gallops.  ABDOMEN: Soft, nontender, nondistended. Bowel sounds present. No organomegaly  or mass.  EXTREMITIES: No pedal edema, cyanosis, or clubbing.  No tenderness in the right hip, range of motion is slightly limited because of the pain. NEUROLOGIC: Cranial nerves II through XII are intact. Muscle strength 5/5 in all extremities. Sensation intact. Gait not checked.  PSYCHIATRIC: The patient is alert and oriented x 3.  SKIN: No obvious rash, lesion, or ulcer.    LABORATORY PANEL:   CBC Recent Labs  Lab 09/08/17 0413  WBC 11.9*  HGB 10.4*  HCT 32.0*  PLT 421   ------------------------------------------------------------------------------------------------------------------  Chemistries  Recent Labs  Lab 09/04/17 2222  09/08/17 0413  NA 137   < > 141  K 4.0   < > 3.3*  CL 102   < > 107  CO2 23   < > 26  GLUCOSE 110*   < > 107*  BUN 16   < > 15  CREATININE 1.08*   < > 0.85  CALCIUM 8.8*   < > 8.0*  AST 67*  --   --   ALT 24  --   --   ALKPHOS 106  --   --   BILITOT 0.7  --   --    < > = values in this interval not displayed.   ------------------------------------------------------------------------------------------------------------------  Cardiac Enzymes Recent Labs  Lab 09/05/17 0323  TROPONINI 0.03*   ------------------------------------------------------------------------------------------------------------------  RADIOLOGY:  No results found.  EKG:   Orders placed or performed during the hospital encounter of 09/04/17  . ED EKG  . ED EKG  . EKG 12-Lead  . EKG 12-Lead  . EKG 12-Lead  . EKG 12-Lead    ASSESSMENT AND PLAN:   Respiratory failure secondary to bilateral pneumonia: Continue  IV abx for one more day to finish IV abx.CTchest is negative for cancer.  : Sepsis pneumonia and COPD exacerbation: Improving with antibiotics.     #2. metastatic cervical cancer with metastases to liver, bone: Patient had cervical biopsy by OB/GYN ,follow with GYN ONC as as out pt  With biopsy result as per GYn note. patient also was seen by Dr.  Mike Gip from oncology, pending biopsy results to discuss treatment options, seen by  Dr.Crystal from Radiation oncology ,start palliative radiation therapy next week.  Appreciate palliative care input/.  Discussed about her illness and also code status during her hospital stay ,she wants to be full code.  Prognosis poor  Discussed with nurse,  Patient  All the records are reviewed and case discussed with Care Management/Social Workerr. Management plans discussed with the patient, family and they are in agreement.  CODE STATUS: Full code, discussed the CODE STATUS with her again and she wanted to be full code.  TOTAL TIME TAKING CARE OF THIS PATIENT: 66minutes.   POSSIBLE D/C IN 1-2 DAYS, DEPENDING ON CLINICAL CONDITION.   Epifanio Lesches M.D on 09/11/2017 at 6:24 PM  Between 7am to 6pm - Pager - [phone]  After 6pm go to [email or web address] - password [email or web address] [facility]  Office  [phone]  CC: Primary care physician; Letta Median, MD   Note: This dictation was prepared with Dragon dictation along with smaller phrase technology. Any transcriptional errors that result from this process are unintentional.

## 2017-09-12 LAB — GLUCOSE, CAPILLARY: GLUCOSE-CAPILLARY: 82 mg/dL (ref 65–99)

## 2017-09-12 MED ORDER — BUDESONIDE 0.5 MG/2ML IN SUSP
0.5000 mg | Freq: Two times a day (BID) | RESPIRATORY_TRACT | Status: DC
  Administered 2017-09-12 – 2017-09-15 (×6): 0.5 mg via RESPIRATORY_TRACT
  Filled 2017-09-12 (×6): qty 2

## 2017-09-12 MED ORDER — PANTOPRAZOLE SODIUM 40 MG PO TBEC
40.0000 mg | DELAYED_RELEASE_TABLET | Freq: Every day | ORAL | Status: DC
  Administered 2017-09-13 – 2017-09-15 (×3): 40 mg via ORAL
  Filled 2017-09-12 (×3): qty 1

## 2017-09-12 MED ORDER — IPRATROPIUM-ALBUTEROL 0.5-2.5 (3) MG/3ML IN SOLN
3.0000 mL | Freq: Two times a day (BID) | RESPIRATORY_TRACT | Status: DC
  Administered 2017-09-12: 3 mL via RESPIRATORY_TRACT
  Filled 2017-09-12: qty 3

## 2017-09-12 MED ORDER — LEVALBUTEROL HCL 1.25 MG/0.5ML IN NEBU
1.2500 mg | INHALATION_SOLUTION | Freq: Four times a day (QID) | RESPIRATORY_TRACT | Status: DC
  Administered 2017-09-12: 1.25 mg via RESPIRATORY_TRACT
  Filled 2017-09-12 (×4): qty 0.5

## 2017-09-12 MED ORDER — LEVALBUTEROL HCL 1.25 MG/0.5ML IN NEBU
1.2500 mg | INHALATION_SOLUTION | Freq: Four times a day (QID) | RESPIRATORY_TRACT | Status: DC
  Filled 2017-09-12: qty 0.5

## 2017-09-12 MED ORDER — MEGESTROL ACETATE 400 MG/10ML PO SUSP
400.0000 mg | Freq: Every day | ORAL | Status: DC
  Administered 2017-09-12 – 2017-09-15 (×4): 400 mg via ORAL
  Filled 2017-09-12 (×4): qty 10

## 2017-09-12 MED ORDER — NYSTATIN 100000 UNIT/ML MT SUSP
5.0000 mL | Freq: Four times a day (QID) | OROMUCOSAL | Status: DC
  Administered 2017-09-12 – 2017-09-15 (×13): 500000 [IU] via ORAL
  Filled 2017-09-12 (×12): qty 5

## 2017-09-12 MED ORDER — IPRATROPIUM BROMIDE 0.02 % IN SOLN: 0.5000 mg | Freq: Four times a day (QID) | RESPIRATORY_TRACT | Status: DC

## 2017-09-12 MED ORDER — IPRATROPIUM BROMIDE 0.02 % IN SOLN
0.5000 mg | Freq: Four times a day (QID) | RESPIRATORY_TRACT | Status: DC
  Administered 2017-09-12: 0.5 mg via RESPIRATORY_TRACT
  Filled 2017-09-12: qty 2.5

## 2017-09-12 MED ORDER — DILTIAZEM HCL ER 60 MG PO CP12
60.0000 mg | ORAL_CAPSULE | Freq: Two times a day (BID) | ORAL | Status: DC
  Administered 2017-09-12 – 2017-09-15 (×6): 60 mg via ORAL
  Filled 2017-09-12 (×7): qty 1

## 2017-09-12 NOTE — Clinical Social Work Note (Signed)
CSW met with the patient at bedside to discuss LTC options. The patient reported that she does not wish to pursue such at this time; however, she does plan to do so in the future "once things get cleared up at home." The CSW discussed the patient's support system and advised the patient of area resources for support should she choose. The CSW is signing off. Please consult should needs arise.  Santiago Bumpers, MSW, Latanya Presser [phone]

## 2017-09-12 NOTE — Progress Notes (Signed)
[facility] [place] NAME: Stacy Zhang    MR#:  [id number]  DATE OF BIRTH:  Aug 18, 1950  SUBJECTIVE:  CHIEF COMPLAINT:   Chief Complaint  Patient presents with  . Shortness of Breath  . Respiratory Distress  She will not interested and rehab/nursing home placement, patient states that she would like to go home when ready for discharge, advance directives as well as hospice and palliative care services were discussed at length with all questions answered, noted continued tachycardia/hypoxia  REVIEW OF SYSTEMS:  CONSTITUTIONAL: No fever, fatigue or weakness.  EYES: No blurred or double vision.  EARS, NOSE, AND THROAT: No tinnitus or ear pain.  RESPIRATORY: No cough, shortness of breath, wheezing or hemoptysis.  CARDIOVASCULAR: No chest pain, orthopnea, edema.  GASTROINTESTINAL: No nausea, vomiting, diarrhea or abdominal pain.  GENITOURINARY: No dysuria, hematuria.  ENDOCRINE: No polyuria, nocturia,  HEMATOLOGY: No anemia, easy bruising or bleeding SKIN: No rash or lesion. MUSCULOSKELETAL: No joint pain or arthritis.   NEUROLOGIC: No tingling, numbness, weakness.  PSYCHIATRY: No anxiety or depression.   ROS  DRUG ALLERGIES:  No Known Allergies  VITALS:  Blood pressure 135/70, pulse (!) 110, temperature 98.3 F (36.8 C), temperature source Oral, resp. rate 18, height 5\' 2"  (1.575 m), weight 38.6 kg (85 lb 3.2 oz), SpO2 (!) 89 %.  PHYSICAL EXAMINATION:  GENERAL:  67 y.o.-year-old patient lying in the bed with no acute distress.  Cachectic appearance EYES: Pupils equal, round, reactive to light and accommodation. No scleral icterus. Extraocular muscles intact.  HEENT: Head atraumatic, normocephalic. Oropharynx and nasopharynx clear.  NECK:  Supple, no jugular venous distention. No thyroid enlargement, no tenderness.  LUNGS: Normal breath sounds bilaterally, no wheezing, rales,rhonchi or crepitation. No use of accessory muscles of  respiration.  CARDIOVASCULAR: S1, S2 normal. No murmurs, rubs, or gallops.  ABDOMEN: Soft, nontender, nondistended. Bowel sounds present. No organomegaly or mass.  EXTREMITIES: No pedal edema, cyanosis, or clubbing.  NEUROLOGIC: Cranial nerves II through XII are intact. MAES. Gait not checked.  PSYCHIATRIC: The patient is alert and oriented x 3.  SKIN: No obvious rash, lesion, or ulcer.   Physical Exam LABORATORY PANEL:   CBC Recent Labs  Lab 09/08/17 0413  WBC 11.9*  HGB 10.4*  HCT 32.0*  PLT 421   ------------------------------------------------------------------------------------------------------------------  Chemistries  Recent Labs  Lab 09/08/17 0413  NA 141  K 3.3*  CL 107  CO2 26  GLUCOSE 107*  BUN 15  CREATININE 0.85  CALCIUM 8.0*   ------------------------------------------------------------------------------------------------------------------  Cardiac Enzymes No results for input(s): TROPONINI in the last 168 hours. ------------------------------------------------------------------------------------------------------------------  RADIOLOGY:  No results found.  ASSESSMENT AND PLAN:  1 acute hypoxic respiratory failure  Secondary to bilateral pneumonia and acute on COPD exacerbation  Stable  Treated with course of IV antibiotics while in house, suspect patient may require home oxygen at discharge, CT chest negative for cancer   2 acute septicemia  Resolved  Secondary to pneumonia   3 acute on chronic obstructive pulmonary disease exacerbation  Resolving  Continue IV Solu-Medrol with tapering as tolerated, start inhaled corticosteroids twice daily, scheduled Xopenex/ipratropium every 6 hours, and continue close medical monitoring   4 newly diagnosed incurable metastatic cervical cancer with metastases to liver/bone  S/p cervical bx by OB/GYN -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 Dale [place] did see patient while in house,  will need to follow-up status post discharge for continued care regarding treatment options-plan is for palliative chemotherapy Radiation  oncology did see patient in house as well-plans for palliative radiation therapy status post discharge Palliative care did see patient while in house  Disposition to home with palliative care versus hospice, long discussion was had with the patient regarding advanced directives as well, possible home in 1-3 days pending clinical course Long-term prognosis terminal  All the records are reviewed and case discussed with Care Management/Social Workerr. Management plans discussed with the patient, family and they are in agreement.  CODE STATUS: full  TOTAL TIME TAKING CARE OF THIS PATIENT: 45 minutes.   POSSIBLE D/C IN 1-3 DAYS, DEPENDING ON CLINICAL CONDITION.   Avel Peace Leiya Keesey M.D on 09/12/2017   Between 7am to 6pm - Pager - [phone]  After 6pm go to [email or web address] - password [email or web address] [facility]  Office  [phone]  CC: Primary care physician; Letta Median, MD  Note: This dictation was prepared with Dragon dictation along with smaller phrase technology. Any transcriptional errors that result from this process are unintentional.

## 2017-09-13 LAB — GLUCOSE, CAPILLARY: GLUCOSE-CAPILLARY: 85 mg/dL (ref 65–99)

## 2017-09-13 MED ORDER — PREDNISONE 50 MG PO TABS: ORAL_TABLET | ORAL | 0 refills | Status: AC

## 2017-09-13 MED ORDER — LEVALBUTEROL HCL 1.25 MG/0.5ML IN NEBU
1.2500 mg | INHALATION_SOLUTION | Freq: Four times a day (QID) | RESPIRATORY_TRACT | Status: DC | PRN
  Filled 2017-09-13: qty 0.5

## 2017-09-13 MED ORDER — PANTOPRAZOLE SODIUM 40 MG PO TBEC: 40.0000 mg | DELAYED_RELEASE_TABLET | Freq: Every day | ORAL | 0 refills | Status: AC

## 2017-09-13 MED ORDER — ALPRAZOLAM 0.5 MG PO TABS: 0.5000 mg | ORAL_TABLET | Freq: Two times a day (BID) | ORAL | 0 refills | Status: AC

## 2017-09-13 MED ORDER — DOCUSATE SODIUM 100 MG PO CAPS: 100.0000 mg | ORAL_CAPSULE | Freq: Two times a day (BID) | ORAL | 0 refills | Status: AC

## 2017-09-13 MED ORDER — MEGESTROL ACETATE 400 MG/10ML PO SUSP: 400.0000 mg | Freq: Every day | ORAL | 0 refills | Status: AC

## 2017-09-13 MED ORDER — BUDESONIDE-FORMOTEROL FUMARATE 160-4.5 MCG/ACT IN AERO: 2.0000 | INHALATION_SPRAY | Freq: Two times a day (BID) | RESPIRATORY_TRACT | 12 refills | Status: AC

## 2017-09-13 MED ORDER — NYSTATIN 100000 UNIT/ML MT SUSP: 5.0000 mL | Freq: Four times a day (QID) | OROMUCOSAL | 0 refills | Status: AC

## 2017-09-13 MED ORDER — LEVALBUTEROL HCL 1.25 MG/0.5ML IN NEBU
1.2500 mg | INHALATION_SOLUTION | Freq: Three times a day (TID) | RESPIRATORY_TRACT | Status: DC
  Administered 2017-09-13 – 2017-09-15 (×7): 1.25 mg via RESPIRATORY_TRACT
  Filled 2017-09-13 (×9): qty 0.5

## 2017-09-13 MED ORDER — ENSURE ENLIVE PO LIQD: 237.0000 mL | Freq: Three times a day (TID) | ORAL | 12 refills | Status: AC

## 2017-09-13 MED ORDER — IPRATROPIUM BROMIDE 0.02 % IN SOLN
0.5000 mg | Freq: Three times a day (TID) | RESPIRATORY_TRACT | Status: DC
  Administered 2017-09-13 – 2017-09-15 (×8): 0.5 mg via RESPIRATORY_TRACT
  Filled 2017-09-13 (×8): qty 2.5

## 2017-09-13 MED ORDER — GUAIFENESIN ER 600 MG PO TB12: 600.0000 mg | ORAL_TABLET | Freq: Two times a day (BID) | ORAL | 0 refills | Status: AC

## 2017-09-13 MED ORDER — HYDROCODONE-ACETAMINOPHEN 5-325 MG PO TABS
1.0000 | ORAL_TABLET | ORAL | 0 refills | Status: AC | PRN
Start: 2017-09-13 — End: ?

## 2017-09-13 MED ORDER — DILTIAZEM HCL ER COATED BEADS 120 MG PO CP24: 120.0000 mg | ORAL_CAPSULE | Freq: Every day | ORAL | 0 refills | Status: AC

## 2017-09-13 NOTE — Progress Notes (Signed)
SATURATION QUALIFICATIONS: (This note is used to comply with regulatory documentation for home oxygen)  Patient Saturations on Room Air at Rest = 85%  Patient Saturations on 2 liters at rest =94%  Patient Saturations on 2 Liters of oxygen while Ambulating = 00%  Unable to ambulate patient.   Please briefly explain why patient needs home oxygen: Acute on chronic hypoxic respiratory failure.  02 Sats obtained by Georgina Quint RN.

## 2017-09-13 NOTE — Discharge Summary (Addendum)
[facility] at [place] NAME: Stacy Zhang    MR#:  [id number]  DATE OF BIRTH:  1951/06/28  DATE OF ADMISSION:  09/04/2017 ADMITTING PHYSICIAN: Amelia Jo, MD  DATE OF DISCHARGE: No discharge date for patient encounter.  PRIMARY CARE PHYSICIAN: Letta Median, MD    ADMISSION DIAGNOSIS:  Acute respiratory failure with hypoxia ([place]) [J96.01] Multifocal pneumonia [J18.9]  DISCHARGE DIAGNOSIS:  Active Problems:   Sepsis ([place])   Protein-calorie malnutrition, severe   Gross hematuria   Urinary retention   SECONDARY DIAGNOSIS:   Past Medical History:  Diagnosis Date  . COPD (chronic obstructive pulmonary disease) ([place])     HOSPITAL COURSE:  1 acute on chronic hypoxic respiratory failure Resolved  Secondary to bilateral pneumonia and acute on COPD exacerbation  Stable  Treated with course of IV antibiotics while in house, for home oxygen L continous at home, CT chest negative for cancer   2 acute septicemia  Resolved  Secondary to pneumonia   3 acute on chronic obstructive pulmonary disease exacerbation  Resolving  Treated with IV Solu-Medrol while in house, inhaled corticosteroids twice daily, BTs, and pt did well   4 newly diagnosed incurable metastatic cervical cancer with metastases to liver/bone  S/p cervical bx by OB/GYN -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 Dale [place] did see patient while in house, will need to follow-up status post discharge for continued care regarding treatment options-plan is for palliative chemotherapy Radiation oncology did see patient in house as well-plans for palliative radiation therapy status post discharge Palliative care did see patient while in house and will be continued at home    DISCHARGE CONDITIONS:  Stable condition, prognosis is terminal, for palliative care at home  CONSULTS OBTAINED:  Treatment Team:  Hollice Espy, MD [place],  Gwen Her, MD Noreene Filbert, MD  DRUG ALLERGIES:  No Known Allergies  DISCHARGE MEDICATIONS:   Allergies as of 09/13/2017   No Known Allergies     Medication List    STOP taking these medications   sulfamethoxazole-trimethoprim 800-160 MG tablet Commonly known as:  BACTRIM DS,SEPTRA DS     TAKE these medications   ALPRAZolam 0.5 MG tablet Commonly known as:  XANAX Take 1 tablet (0.5 mg total) by mouth 2 (two) times daily.   budesonide-formoterol 160-4.5 MCG/ACT inhaler Commonly known as:  SYMBICORT Inhale 2 puffs into the lungs 2 (two) times daily.   COMBIVENT RESPIMAT 20-100 MCG/ACT Aers respimat Generic drug:  Ipratropium-Albuterol Inhale 2 puffs into the lungs 2 (two) times daily.   diltiazem 120 MG 24 hr capsule Commonly known as:  CARDIZEM CD Take 1 capsule (120 mg total) by mouth daily.   docusate sodium 100 MG capsule Commonly known as:  COLACE Take 1 capsule (100 mg total) by mouth 2 (two) times daily.   feeding supplement (ENSURE ENLIVE) Liqd Take 237 mLs by mouth 3 (three) times daily between meals.   fluticasone 50 MCG/ACT nasal spray Commonly known as:  FLONASE Place 1 spray into both nostrils daily.   guaiFENesin 600 MG 12 hr tablet Commonly known as:  MUCINEX Take 1 tablet (600 mg total) by mouth 2 (two) times daily.   HYDROcodone-acetaminophen 5-325 MG tablet Commonly known as:  NORCO/VICODIN Take 1-2 tablets by mouth every 4 (four) hours as needed for moderate pain.   megestrol 400 MG/10ML suspension Commonly known as:  MEGACE Take 10 mLs (400 mg total) by mouth daily. Start taking on:  09/14/2017  nystatin 100000 UNIT/ML suspension Commonly known as:  MYCOSTATIN Take 5 mLs (500,000 Units total) by mouth 4 (four) times daily.   pantoprazole 40 MG tablet Commonly known as:  PROTONIX Take 1 tablet (40 mg total) by mouth daily. Start taking on:  09/14/2017   predniSONE 50 MG tablet Commonly known as:  DELTASONE 1 po daily    valACYclovir 1000 MG tablet Commonly known as:  VALTREX Take 1 tablet (1,000 mg total) by mouth 3 (three) times daily.        DISCHARGE INSTRUCTIONS:    If you experience worsening of your admission symptoms, develop shortness of breath, life threatening emergency, suicidal or homicidal thoughts you must seek medical attention immediately by calling 911 or calling your MD immediately  if symptoms less severe.  You Must read complete instructions/literature along with all the possible adverse reactions/side effects for all the Medicines you take and that have been prescribed to you. Take any new Medicines after you have completely understood and accept all the possible adverse reactions/side effects.   Please note  You were cared for by a hospitalist during your hospital stay. If you have any questions about your discharge medications or the care you received while you were in the hospital after you are discharged, you can call the unit and asked to speak with the hospitalist on call if the hospitalist that took care of you is not available. Once you are discharged, your primary care physician will handle any further medical issues. Please note that NO REFILLS for any discharge medications will be authorized once you are discharged, as it is imperative that you return to your primary care physician (or establish a relationship with a primary care physician if you do not have one) for your aftercare needs so that they can reassess your need for medications and monitor your lab values.    Today   CHIEF COMPLAINT:   Chief Complaint  Patient presents with  . Shortness of Breath  . Respiratory Distress    HISTORY OF PRESENT ILLNESS:  67 y.o. female with a known history of COPD, not on home oxygen.  Patient is a former smoker, she quit cigarettes 20 years ago. She came to emergency room via EMS for acute onset of shortness of breath and productive cough started in the past 24-48 hours.   She used her nebulizer treatment, at home every 3 hours, without much improvement.  Patient feels very weak, exhausted.  Patient has left-sided chest pain, under the ribs with cough and deep inspiration. She says she felt hot, but she did not check her temperature, at home.  She denies any nausea, vomiting, diarrhea or bleeding. Upon evaluation in the emergency room, patient was found to be hypoxemic.  She currently requires 3.5 L per nasal cannula to maintain 94% oxygen saturation.  Temperature was 100.3 and heart rate still is in 120s.  Blood tests are remarkable for elevated WBC at 37,000 and elevated lactic acid at 2.2.  Troponin level is 0.03. CAT scan of the chest is negative for any PE but confirms bilateral pneumonia. Patient is admitted for further evaluation and treatment     VITAL SIGNS:  Blood pressure 131/70, pulse (!) 106, temperature 98.5 F (36.9 C), temperature source Oral, resp. rate 16, height 5\' 2"  (1.575 m), weight 36.5 kg (80 lb 8 oz), SpO2 95 %.  I/O:    Intake/Output Summary (Last 24 hours) at 09/13/2017 1101 Last data filed at 09/13/2017 0956 Gross per 24 hour  Intake -  Output 1600 ml  Net -1600 ml    PHYSICAL EXAMINATION:  GENERAL:  67 y.o.-year-old patient lying in the bed with no acute distress.  EYES: Pupils equal, round, reactive to light and accommodation. No scleral icterus. Extraocular muscles intact.  HEENT: Head atraumatic, normocephalic. Oropharynx and nasopharynx clear.  NECK:  Supple, no jugular venous distention. No thyroid enlargement, no tenderness.  LUNGS: Normal breath sounds bilaterally, no wheezing, rales,rhonchi or crepitation. No use of accessory muscles of respiration.  CARDIOVASCULAR: S1, S2 normal. No murmurs, rubs, or gallops.  ABDOMEN: Soft, non-tender, non-distended. Bowel sounds present. No organomegaly or mass.  EXTREMITIES: No pedal edema, cyanosis, or clubbing.  NEUROLOGIC: Cranial nerves II through XII are intact. Muscle strength  5/5 in all extremities. Sensation intact. Gait not checked.  PSYCHIATRIC: The patient is alert and oriented x 3.  SKIN: No obvious rash, lesion, or ulcer.   DATA REVIEW:   CBC Recent Labs  Lab 09/08/17 0413  WBC 11.9*  HGB 10.4*  HCT 32.0*  PLT 421    Chemistries  Recent Labs  Lab 09/08/17 0413  NA 141  K 3.3*  CL 107  CO2 26  GLUCOSE 107*  BUN 15  CREATININE 0.85  CALCIUM 8.0*    Cardiac Enzymes No results for input(s): TROPONINI in the last 168 hours.  Microbiology Results  Results for orders placed or performed during the hospital encounter of 09/04/17  Blood culture (routine x 2)     Status: None   Collection Time: 09/04/17 11:54 PM  Result Value Ref Range Status   Specimen Description BLOOD LEFT ANTECUBITAL  Final   Special Requests   Final    BOTTLES DRAWN AEROBIC AND ANAEROBIC Blood Culture adequate volume   Culture   Final    NO GROWTH 5 DAYS Performed at [facility], [address], [place], [place] [address]    Report Status 09/10/2017 FINAL  Final  Blood culture (routine x 2)     Status: None   Collection Time: 09/04/17 11:55 PM  Result Value Ref Range Status   Specimen Description BLOOD BLOOD LEFT ARM  Final   Special Requests   Final    BOTTLES DRAWN AEROBIC AND ANAEROBIC Blood Culture adequate volume   Culture   Final    NO GROWTH 5 DAYS Performed at [facility], [address], [place], [place] [address]    Report Status 09/10/2017 FINAL  Final  Culture, expectorated sputum-assessment     Status: None   Collection Time: 09/06/17  7:52 PM  Result Value Ref Range Status   Specimen Description EXPECTORATED SPUTUM  Final   Special Requests Normal  Final   Sputum evaluation   Final    THIS SPECIMEN IS ACCEPTABLE FOR SPUTUM CULTURE Performed at [facility], [address], [place], [place] [address]    Report Status 09/06/2017 FINAL  Final  Culture, respiratory (NON-Expectorated)     Status: None    Collection Time: 09/06/17  7:52 PM  Result Value Ref Range Status   Specimen Description   Final    EXPECTORATED SPUTUM Performed at [facility], [address] [place], [place] [address]    Special Requests   Final    Normal Reflexed from [id number] Performed at [facility], [address] [place], [place] [address]    Gram Stain   Final    RARE WBC PRESENT, PREDOMINANTLY PMN RARE GRAM POSITIVE COCCI RARE GRAM POSITIVE RODS  Culture   Final    Consistent with normal respiratory flora. Performed at [facility], [place] [address], [place], [place] [address]    Report Status 09/09/2017 FINAL  Final    RADIOLOGY:  No results found.  EKG:   Orders placed or performed during the hospital encounter of 09/04/17  . ED EKG  . ED EKG  . EKG 12-Lead  . EKG 12-Lead  . EKG 12-Lead  . EKG 12-Lead      Management plans discussed with the patient, family and they are in agreement.  CODE STATUS:     Code Status Orders  (From admission, onward)        Start     Ordered   09/13/17 1050  Do not attempt resuscitation (DNR)  Continuous    Question Answer Comment  In the event of cardiac or respiratory ARREST Do not call a "code blue"   In the event of cardiac or respiratory ARREST Do not perform Intubation, CPR, defibrillation or ACLS   In the event of cardiac or respiratory ARREST Use medication by any route, position, wound care, and other measures to relive pain and suffering. May use oxygen, suction and manual treatment of airway obstruction as needed for comfort.      09/13/17 1049    Code Status History    Date Active Date Inactive Code Status Order ID Comments User Context   09/05/2017 03:00 09/13/2017 10:49 Full Code [id number]  Amelia Jo, MD Inpatient      TOTAL TIME TAKING CARE OF THIS PATIENT: 45 minutes.    Avel Peace Salary M.D on 09/13/2017 at 11:01 AM  Between 7am to 6pm - Pager - [phone]  After 6pm go to [email or web address] -  password [email or web address] [facility]  Office  [phone]  CC: Primary care physician; Letta Median, MD   Note: This dictation was prepared with Dragon dictation along with smaller phrase technology. Any transcriptional errors that result from this process are unintentional.

## 2017-09-13 NOTE — Care Management Note (Signed)
Case Management Note  Patient Details  Name: KWEEN BACORN MRN: [id number] Date of Birth: 1950-08-09  Subjective/Objective:    Referral for Palliative care at home [facility]                 Action/Plan:   Expected Discharge Date:  09/13/17               Expected Discharge Plan:  [place]  In-House Referral:     Discharge planning Services  CM Consult  Post Acute Care Choice:    Choice offered to:  Patient  DME Arranged:  Oxygen, Walker DME Agency:  [place]:  RN, PT, Nurse's Aide, Social Work [employer] Agency:  [place]  Status of Service:  Completed, signed off  If discussed at [employer], dates discussed:    Additional Comments:  Chane Cowden A, RN 09/13/2017, 5:41 PM

## 2017-09-13 NOTE — Progress Notes (Signed)
Pts foley removed earlier today/ pt still has not voided/ attempted to use BSC - no success/ bladder scanner 262 mls /  Dr. Jerelyn Charles made aware/ will continue to monitor.

## 2017-09-13 NOTE — Progress Notes (Signed)
Pt. Slept well throughout the night with no c/o pain, SOB or acute distress noted. Will continue to monitor pt.  

## 2017-09-13 NOTE — Clinical Social Work Note (Signed)
CSW provided a courtesy call to the patient for emotional support. CSW has sent a Engineering geologist for transportation resources (a list already provided to the patient at earlier contact) and also verbal information on how to contact the [place] to assist with transportation needs. The patient was able to repeat back the information in her own terms. The CSW is signing off.   Santiago Bumpers, MSW, Latanya Presser [phone]

## 2017-09-13 NOTE — Care Management Note (Signed)
Case Management Note  Patient Details  Name: SHERRIN STAHLE MRN: [id number] Date of Birth: Apr 09, 1951  Subjective/Objective:    Mrs Stopher reports that she does not have transportation to the pharmacy or to her home.  Pat[place]ude was given [employer] to both places today and was advised to talk to the SWer on the phone at [employer] about arranging transportation to [employer].                Action/Plan:   Expected Discharge Date:  09/13/17               Expected Discharge Plan:  [place]  In-House Referral:     Discharge planning Services  CM Consult  Post Acute Care Choice:    Choice offered to:  Patient  DME Arranged:  Oxygen, Walker DME Agency:  [place]:  RN, PT, Nurse's Aide, Social Work [employer] Agency:  [place]  Status of Service:  Completed, signed off  If discussed at [employer], dates discussed:    Additional Comments:  Soniyah Mcglory A, RN 09/13/2017, 5:14 PM

## 2017-09-13 NOTE — Progress Notes (Signed)
Bladder scanned pt. x3 highest number I received was 229ml, (147ml, 267ml). Foley was removed at 2:15 today. Dr. Jannifer Franklin paged, no new orders at this time, he just wants to continue to monitor pt. will rescan pt. Later in shift. Pt. States she does not have the urge to urinate and does not want to attempt at this time. Will continue to monitor pt.

## 2017-09-13 NOTE — Care Management Note (Addendum)
Case Management Note  Patient Details  Name: Stacy Zhang MRN: [id number] Date of Birth: 1950/08/10  Subjective/Objective:     Weekend CSW has discussed discharge options with Stacy Zhang who refused offers of placement in a facility. This Probation officer discussed discharge options with Dr Jerelyn Charles who stated that if Stacy Zhang does not want placement that he will discharge her to home with home health services and Palliative Care to follow her at home. This Probation officer explained home health services and Palliative Care to Stacy Zhang who verbalized understanding. A referral was called and faxed to [facility]. A referral for HH=PT, RN, Aide, SW was sent to [place] [facility]. A request for new 02 and a RW was sent to Melene Muller at [facility]. [facility] will deliver a rolling walker and a portable 02 tank to Stacy Zhang room today. Discussed option of EMS vs Taxi for transportation home today. Stacy Zhang was adamant that she wants to go home in a taxi.   Action/Plan:   Expected Discharge Date:  09/13/17               Expected Discharge Plan:  [place]  In-House Referral:     Discharge planning Services  CM Consult  Post Acute Care Choice:    Choice offered to:  Patient  DME Arranged:  Oxygen, Walker DME Agency:  [place]:  RN, PT, Nurse's Aide, Social Work [employer] Agency:  [place]  Status of Service:  Completed, signed off  If discussed at [employer], dates discussed:    Additional Comments:  Bayron Dalto A, RN 09/13/2017, 12:54 PM

## 2017-09-14 ENCOUNTER — Inpatient Hospital Stay
Admit: 2017-09-14 | Discharge: 2017-09-14 | Disposition: A | Discharge status: Home / Self Care | Payer: Self-pay | Attending: Radiation Oncology | Admitting: Radiation Oncology

## 2017-09-14 LAB — GLUCOSE, CAPILLARY: Glucose-Capillary: 107 mg/dL — ABNORMAL HIGH (ref 65–99)

## 2017-09-14 MED ORDER — PREDNISONE 50 MG PO TABS
50.0000 mg | ORAL_TABLET | Freq: Once | ORAL | Status: AC
  Administered 2017-09-14: 50 mg via ORAL
  Filled 2017-09-14: qty 1

## 2017-09-14 NOTE — Care Management (Signed)
Patient has been seen at [facility]  Referral has been made to APS and patient is aware of report  Referral accepted by [facility] who will see patient within 24 hours  Referral has been made and accepted by [facility] for outpatient palliative  Physical therapy assessed patient and able to navigate stairs  Patient's medications have been filled by [facility] and delivered to unit to be transported home with patient  Patient has verbalized she has a key to her residence and this has been seen by CM  Portable oxygen and walker may transport with patient per [facility]  Patient to transport by EMS as it is not safe to discharge her in a cab   DNR to transport with patient

## 2017-09-14 NOTE — Progress Notes (Signed)
New referral for out patient Palliative to follow at home, received from [facility] as a follow to referral made over the weekend. Plan is for patient to discharge home via EMS today. Updated notes faxed to referral. Flo Shanks RN, BSN, [place] [facility], hospital Liaison [phone]

## 2017-09-14 NOTE — Care Management (Signed)
Informed that patient was to have discharged over the weekend.  She was to have been sent home via cab with new home 02 and walker. Her apartment is on the third floor.  Requesting physical therapy to see today as soon as possible and assess ability to navigate stairs.  She has an appt today at [facility] at [place] with the Patient [place] and discussed concerns regarding: decision to return home and not going to a facility which could provide supervision and assist; lack of any family or caregiver support, lack of transportation,  metastatic cervical cancer to liver and bone, potential for rapid decline with no support.  Cancer center will transport patient to her appointment today then will be returned to the unit.  She will transport home via ems. Contacted EMS and informed that they could transport patient's walker and portable oxygen home. CM explained to patient that discharging via cab is not safe and will not be receiving cab voucher.  Uses [facility] drug store. Drug store does deliver. Faxed scripts.  made APS referral and patient is aware.  Left patient's contact information for medicaid transportation referral.  Patient says she will think about going to a facility but at present she needs to go home 'and do a few things." Will consider it later.  CM explained that it would be easier to be placed from hospital but she is insistent that she has things to do.

## 2017-09-14 NOTE — Progress Notes (Signed)
EMS arrived to transport pt/ EMS expressed that they can not take all of her equipment on the truck for safety reason/ expressed to EMS that Nann, Transport planner had spoke to EMS supervisor and had arranged for EMS to transport her oxygen and walker with pt/ EMS again stated  it was against policy to carry the equipment/ pt also expressed to EMS and nurse that she might not have power or water when she arrives home/ Nann, care mag and nursing sup called to make aware/ pt will stay here due to unknown living situation.

## 2017-09-14 NOTE — Progress Notes (Signed)
[facility] [place] NAME: Stacy Zhang    MR#:  [id number]  DATE OF BIRTH:  [birth year]  SUBJECTIVE:  CHIEF COMPLAINT:   Chief Complaint  Patient presents with  . Shortness of Breath  . Respiratory Distress  discharge held for UR  REVIEW OF SYSTEMS:  CONSTITUTIONAL: No fever, fatigue or weakness.  EYES: No blurred or double vision.  EARS, NOSE, AND THROAT: No tinnitus or ear pain.  RESPIRATORY: No cough, shortness of breath, wheezing or hemoptysis.  CARDIOVASCULAR: No chest pain, orthopnea, edema.  GASTROINTESTINAL: No nausea, vomiting, diarrhea or abdominal pain.  GENITOURINARY: No dysuria, hematuria.  ENDOCRINE: No polyuria, nocturia,  HEMATOLOGY: No anemia, easy bruising or bleeding SKIN: No rash or lesion. MUSCULOSKELETAL: No joint pain or arthritis.   NEUROLOGIC: No tingling, numbness, weakness.  PSYCHIATRY: No anxiety or depression.   ROS  DRUG ALLERGIES:  No Known Allergies  VITALS:  Blood pressure 131/68, pulse (!) 108, temperature 97.6 F (36.4 C), temperature source Oral, resp. rate 18, height 5\' 2"  (1.575 m), weight 38.6 kg (85 lb 1.6 oz), SpO2 100 %.  PHYSICAL EXAMINATION:  GENERAL:  67 y.o.-year-old patient lying in the bed with no acute distress.  EYES: Pupils equal, round, reactive to light and accommodation. No scleral icterus. Extraocular muscles intact.  HEENT: Head atraumatic, normocephalic. Oropharynx and nasopharynx clear.  NECK:  Supple, no jugular venous distention. No thyroid enlargement, no tenderness.  LUNGS: Normal breath sounds bilaterally, no wheezing, rales,rhonchi or crepitation. No use of accessory muscles of respiration.  CARDIOVASCULAR: S1, S2 normal. No murmurs, rubs, or gallops.  ABDOMEN: Soft, nontender, nondistended. Bowel sounds present. No organomegaly or mass.  EXTREMITIES: No pedal edema, cyanosis, or clubbing.  NEUROLOGIC: Cranial nerves II through XII are intact. Muscle strength 5/5 in  all extremities. Sensation intact. Gait not checked.  PSYCHIATRIC: The patient is alert and oriented x 3.  SKIN: No obvious rash, lesion, or ulcer.   Physical Exam LABORATORY PANEL:   CBC Recent Labs  Lab 09/08/17 0413  WBC 11.9*  HGB 10.4*  HCT 32.0*  PLT 421   ------------------------------------------------------------------------------------------------------------------  Chemistries  Recent Labs  Lab 09/08/17 0413  NA 141  K 3.3*  CL 107  CO2 26  GLUCOSE 107*  BUN 15  CREATININE 0.85  CALCIUM 8.0*   ------------------------------------------------------------------------------------------------------------------  Cardiac Enzymes No results for input(s): TROPONINI in the last 168 hours. ------------------------------------------------------------------------------------------------------------------  RADIOLOGY:  No results found.  ASSESSMENT AND PLAN:  1acuteon chronichypoxic respiratory failure Resolved Secondary to bilateral pneumonia and acute on COPD exacerbation  Stable  Treated with course of IV antibiotics while in house,for home oxygen L continous at home,CT chest negative for cancer  2acute septicemia  Resolved  Secondary to pneumonia  3acute on chronic obstructive pulmonary disease exacerbation  Resolving Treated withIV Solu-Medrol while in house,inhaled corticosteroids twice daily, BTs, and pt did well  4newly diagnosed incurable metastatic cervical cancer with metastases to liver/bone  S/pcervical bx byOB/GYN -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 Corcoran/Oncologydid see patient while in house, will need to follow-up status post discharge for continued care regarding treatment options-plan is for palliative chemotherapy Radiation oncology did see patient in house as well-plans for palliative radiation therapy status post discharge Palliative care did see patient while in houseand will be continued at  home  5 acute urinary retention Discharge held Straight cath prn  All the records are reviewed and case discussed with Care Management/Social Workerr. Management plans discussed with the  patient, family and they are in agreement.  CODE STATUS:   TOTAL TIME TAKING CARE OF THIS PATIENT: 35 minutes.     POSSIBLE D/C IN 1 DAYS, DEPENDING ON CLINICAL CONDITION.   Avel Peace  M.D on 09/14/2017   Between 7am to 6pm - Pager - [phone]  After 6pm go to [email or web address] - password [email or web address] [facility]  Office  [phone]  CC: Primary care physician; Letta Median, MD  Note: This dictation was prepared with Dragon dictation along with smaller phrase technology. Any transcriptional errors that result from this process are unintentional.

## 2017-09-14 NOTE — Progress Notes (Signed)
Pt. Bladder scanned again 536ml, 475ml and 560ml. Pt. Attempting to urinate on bedside commode at this time. Prime paged. Awaiting call back.

## 2017-09-14 NOTE — Progress Notes (Signed)
Discharge instructions explained to pt/ verbalized an understanding/ iv and tele removed/ EMS called to transport home

## 2017-09-14 NOTE — Progress Notes (Signed)
Physical Therapy Treatment Patient Details Name: Stacy Zhang MRN: [id number] DOB: [birth year] Today's Date: 09/14/2017    History of Present Illness Pt admitted following c/o acute SOB and productive cough and being found to have hypoxemia.  PMH includes COPD.    PT Comments    Pt ambulates a lap around RN station with therapy. She is able to stabilize using a rolling walker. Without walker pt requires minA+1 due to instability with attempted ambulation. SaO2 drops to 90% on room air at rest. With activity it drops further to 83% and HR increases to 128. Supplemental O2 applied at 2L/min and SaO2 recovers to 92% and remains between 89-91% during activity. Gait is very slow and pt takes a long time to ambulate around RN station. Pt unable to demonstrate meaningful change in gait speed with cues. Pt able to ascend/descend 6 steps with bilateral UE support on R railing. Step-to pattern and +2 required to assist with oxygen tank. Speed is slow but pt is steady. Pt considerably fatigued at top of stairs. O2 has to be increased to 4L/min in order for SaO2 to remain at or above 88%. Pt would be much safer and more appropriate for SNF placement at discharge. Therapist has significant doubts whether pt can ascend the 20 steps to enter her apartment or in case of emergency leave her apartment. Pt also has no plans for how she can get groceries, medications, or prepare meals. Considerable lack of foresight exist during conversation with patient. Pt will benefit from PT services to address deficits in strength, balance, and mobility in order to return to full function at home.     Follow Up Recommendations  SNF;Other (comment)(Pt currently refuses SNF placement, Please arrange [facility]). Pt does agree to [facility]     Equipment Recommendations  Rolling walker with 5" wheels    Recommendations for Other Services       Precautions / Restrictions Precautions Precautions: Fall Restrictions Weight Bearing  Restrictions: No    Mobility  Bed Mobility Overal bed mobility: Modified Independent             General bed mobility comments: Requires increased time to perform supine to sit.  Can scoot to EOB independently.  Transfers Overall transfer level: Needs assistance Equipment used: Rolling walker (2 wheeled) Transfers: Sit to/from Stand Sit to Stand: Supervision         General transfer comment: Pt requires 2 attempts but able to come to standing without assistance. Steady with UE support on rolling walker. No verbal cues required today  Ambulation/Gait Ambulation/Gait assistance: Min guard Ambulation Distance (Feet): 200 Feet Assistive device: Rolling walker (2 wheeled)   Gait velocity: Decreased   General Gait Details: Pt ambulates a lap around RN station. Pt is able to stabilize with rolling walker. Without walker pt requires minA+1 due to instability. SaO2 drops to 90% on room air at rest. With activity it drops further to 83% and HR increases to 128. Supplemental O2 applied at 2L/min and SaO2 recovers to 92% and remains between 89-91% during activity. Gait is very slow and pt takes a long time to ambulate around RN station. Pt unable to demonstrate meaningful change in gait speed with cues   Stairs Stairs: Yes   Stair Management: One rail Right;Step to pattern Number of Stairs: 6 General stair comments: Pt able to ascend/descend 6 steps with bilateral UE support on R railing. Step-to pattern and +2 required to assist with oxygen tank. Speed is slow but pt  is steady. Pt considerably fatigued at top of stairs. O2 has to be increased to 4L/min in order for SaO2 to remain at or above 88%.  Wheelchair Mobility    Modified Rankin (Stroke Patients Only)       Balance Overall balance assessment: Needs assistance Sitting-balance support: No upper extremity supported Sitting balance-Leahy Scale: Fair     Standing balance support: Bilateral upper extremity  supported Standing balance-Leahy Scale: Fair                              Cognition Arousal/Alertness: Awake/alert Behavior During Therapy: WFL for tasks assessed/performed Overall Cognitive Status: Within Functional Limits for tasks assessed                                        Exercises      General Comments        Pertinent Vitals/Pain Pain Assessment: No/denies pain Pain Intervention(s): Monitored during session    Home Living                      Prior Function            PT Goals (current goals can now be found in the care plan section) Acute Rehab PT Goals Patient Stated Goal: To return home and resume normal level of independence PT Goal Formulation: With patient Time For Goal Achievement: 09/24/17 Potential to Achieve Goals: Good Progress towards PT goals: Progressing toward goals    Frequency    Min 2X/week      PT Plan Discharge plan needs to be updated    Co-evaluation              AM-PAC PT "6 Clicks" Daily Activity  Outcome Measure  Difficulty turning over in bed (including adjusting bedclothes, sheets and blankets)?: A Little Difficulty moving from lying on back to sitting on the side of the bed? : A Little Difficulty sitting down on and standing up from a chair with arms (e.g., wheelchair, bedside commode, etc,.)?: A Little Help needed moving to and from a bed to chair (including a wheelchair)?: A Little Help needed walking in hospital room?: A Little Help needed climbing 3-5 steps with a railing? : A Little 6 Click Score: 18    End of Session Equipment Utilized During Treatment: Gait belt;Oxygen Activity Tolerance: Patient tolerated treatment well Patient left: in bed;with bed alarm set;with call bell/phone within reach   PT Visit Diagnosis: Unsteadiness on feet (R26.81);Muscle weakness (generalized) (M62.81);Pain     Time: 1350-1421 PT Time Calculation (min) (ACUTE ONLY): 31  min  Charges:  $Gait Training: 23-37 mins                    G Codes:       Lyndel Safe Elena Cothern PT, DPT     Mervyn Pflaum 09/14/2017, 3:57 PM

## 2017-09-14 NOTE — Care Management Important Message (Signed)
Important Message  Patient Details  Name: Stacy Zhang MRN: [id number] Date of Birth: 03-Aug-1951   Medicare Important Message Given:  Yes    Katrina Stack, RN 09/14/2017, 2:26 PM

## 2017-09-14 NOTE — Progress Notes (Signed)
Pt. Able to urinate 200 by pressing on her bladder which per her is something she does at home as well. Dr. Marcille Blanco aware. No new orders at this time. Will continue to monitor pt.

## 2017-09-14 NOTE — Discharge Summary (Signed)
[facility] [place] NAME: Stacy Zhang    MR#:  [id number]  DATE OF BIRTH:  [birth year]  DATE OF ADMISSION:  09/04/2017 ADMITTING PHYSICIAN: Amelia Jo, MD  DATE OF DISCHARGE: No discharge date for patient encounter.  PRIMARY CARE PHYSICIAN: Letta Median, MD    ADMISSION DIAGNOSIS:  Acute respiratory failure with hypoxia ([place]) [J96.01] Multifocal pneumonia [J18.9]  DISCHARGE DIAGNOSIS:  Active Problems:   Sepsis ([place])   Protein-calorie malnutrition, severe   Gross hematuria   Urinary retention   SECONDARY DIAGNOSIS:   Past Medical History:  Diagnosis Date  . COPD (chronic obstructive pulmonary disease) [facility])     HOSPITAL COURSE:  1acute on chronic hypoxic respiratory failure Resolved  Secondary to bilateral pneumonia and acute on COPD exacerbation  Stable  Treated with course of IV antibiotics while in house, for home oxygen L continous at home, CT chest negative for cancer  2acute septicemia  Resolved  Secondary to pneumonia  3acute on chronic obstructive pulmonary disease exacerbation  Resolving  Treated with IV Solu-Medrol while in house, inhaled corticosteroids twice daily, BTs, and pt did well  4newly diagnosed incurable metastatic cervical cancer with metastases to liver/bone  S/pcervical bx byOB/GYN -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 Corcoran/Oncologydid see patient while in house, will need to follow-up status post discharge for continued care regarding treatment options-plan is for palliative chemotherapy Radiation oncology did see patient in house as well-plans for palliative radiation therapy status post discharge Palliative care did see patient while in house and will be continued at home  5 acute urinary retention Resolved Discharge delayed by one day  DISCHARGE CONDITIONS:  Stable condition, prognosis is terminal, for palliative care at  home     CONSULTS OBTAINED:  Treatment Team:  Hollice Espy, MD Schermerhorn, Gwen Her, MD Noreene Filbert, MD  DRUG ALLERGIES:  No Known Allergies  DISCHARGE MEDICATIONS:   Allergies as of 09/14/2017   No Known Allergies     Medication List    STOP taking these medications   sulfamethoxazole-trimethoprim 800-160 MG tablet Commonly known as:  BACTRIM DS,SEPTRA DS     TAKE these medications   ALPRAZolam 0.5 MG tablet Commonly known as:  XANAX Take 1 tablet (0.5 mg total) by mouth 2 (two) times daily.   budesonide-formoterol 160-4.5 MCG/ACT inhaler Commonly known as:  SYMBICORT Inhale 2 puffs into the lungs 2 (two) times daily.   COMBIVENT RESPIMAT 20-100 MCG/ACT Aers respimat Generic drug:  Ipratropium-Albuterol Inhale 2 puffs into the lungs 2 (two) times daily.   diltiazem 120 MG 24 hr capsule Commonly known as:  CARDIZEM CD Take 1 capsule (120 mg total) by mouth daily.   docusate sodium 100 MG capsule Commonly known as:  COLACE Take 1 capsule (100 mg total) by mouth 2 (two) times daily.   feeding supplement (ENSURE ENLIVE) Liqd Take 237 mLs by mouth 3 (three) times daily between meals.   fluticasone 50 MCG/ACT nasal spray Commonly known as:  FLONASE Place 1 spray into both nostrils daily.   guaiFENesin 600 MG 12 hr tablet Commonly known as:  MUCINEX Take 1 tablet (600 mg total) by mouth 2 (two) times daily.   HYDROcodone-acetaminophen 5-325 MG tablet Commonly known as:  NORCO/VICODIN Take 1-2 tablets by mouth every 4 (four) hours as needed for moderate pain.   megestrol 400 MG/10ML suspension Commonly known as:  MEGACE Take 10 mLs (400 mg total) by mouth daily.   nystatin 100000 UNIT/ML  suspension Commonly known as:  MYCOSTATIN Take 5 mLs (500,000 Units total) by mouth 4 (four) times daily.   pantoprazole 40 MG tablet Commonly known as:  PROTONIX Take 1 tablet (40 mg total) by mouth daily.   predniSONE 50 MG tablet Commonly known as:   DELTASONE 1 po daily   valACYclovir 1000 MG tablet Commonly known as:  VALTREX Take 1 tablet (1,000 mg total) by mouth 3 (three) times daily.            Durable Medical Equipment  (From admission, onward)        Start     Ordered   09/13/17 1409  For home use only DME oxygen  Once    Question Answer Comment  Mode or (Route) Nasal cannula   Liters per Minute 2   Frequency Continuous (stationary and portable oxygen unit needed)   Oxygen conserving device Yes   Oxygen delivery system Gas      09/13/17 1409       DISCHARGE INSTRUCTIONS:   If you experience worsening of your admission symptoms, develop shortness of breath, life threatening emergency, suicidal or homicidal thoughts you must seek medical attention immediately by calling 911 or calling your MD immediately  if symptoms less severe.  You Must read complete instructions/literature along with all the possible adverse reactions/side effects for all the Medicines you take and that have been prescribed to you. Take any new Medicines after you have completely understood and accept all the possible adverse reactions/side effects.   Please note  You were cared for by a hospitalist during your hospital stay. If you have any questions about your discharge medications or the care you received while you were in the hospital after you are discharged, you can call the unit and asked to speak with the hospitalist on call if the hospitalist that took care of you is not available. Once you are discharged, your primary care physician will handle any further medical issues. Please note that NO REFILLS for any discharge medications will be authorized once you are discharged, as it is imperative that you return to your primary care physician (or establish a relationship with a primary care physician if you do not have one) for your aftercare needs so that they can reassess your need for medications and monitor your lab values.    Today    CHIEF COMPLAINT:   Chief Complaint  Patient presents with  . Shortness of Breath  . Respiratory Distress    HISTORY OF PRESENT ILLNESS:   67 y.o.femalewith a known history of COPD,not on home oxygen.Patient is a former smoker,she quit cigarettes 20 years ago. Shecame to emergency room via EMS for acute onset of shortness of breath and productive cough started in the past 24-48 hours.She used her nebulizer treatment,at home every 3 hours,without much improvement.Patient feels very weak,exhausted.Patient has left-sided chest pain,under the ribs with cough and deep inspiration.She says she felthot,but she did not check her temperature,at home.She denies any nausea, vomiting, diarrhea or bleeding. Upon evaluation in the emergency room,patient was found to be hypoxemic.She currently requires 3.5L per nasal cannula to maintain 94% oxygen saturation.Temperature was 100.3 and heart rate still is in 120s.Blood tests are remarkable for elevated WBC at 37,000 and elevated lactic acid at 2.2.Troponin level is 0.03.CAT scan of the chest is negative for any PE but confirms bilateral pneumonia. Patient is admitted for further evaluation and treatment   VITAL SIGNS:  Blood pressure 131/68, pulse (!) 108, temperature 97.6 F (36.4  C), temperature source Oral, resp. rate 18, height 5\' 2"  (1.575 m), weight 38.6 kg (85 lb 1.6 oz), SpO2 100 %.  I/O:    Intake/Output Summary (Last 24 hours) at 09/14/2017 0943 Last data filed at 09/14/2017 0601 Gross per 24 hour  Intake -  Output 950 ml  Net -950 ml    PHYSICAL EXAMINATION:  GENERAL:  67 y.o.-year-old patient lying in the bed with no acute distress.  EYES: Pupils equal, round, reactive to light and accommodation. No scleral icterus. Extraocular muscles intact.  HEENT: Head atraumatic, normocephalic. Oropharynx and nasopharynx clear.  NECK:  Supple, no jugular venous distention. No thyroid enlargement, no tenderness.   LUNGS: Normal breath sounds bilaterally, no wheezing, rales,rhonchi or crepitation. No use of accessory muscles of respiration.  CARDIOVASCULAR: S1, S2 normal. No murmurs, rubs, or gallops.  ABDOMEN: Soft, non-tender, non-distended. Bowel sounds present. No organomegaly or mass.  EXTREMITIES: No pedal edema, cyanosis, or clubbing.  NEUROLOGIC: Cranial nerves II through XII are intact. Muscle strength 5/5 in all extremities. Sensation intact. Gait not checked.  PSYCHIATRIC: The patient is alert and oriented x 3.  SKIN: No obvious rash, lesion, or ulcer.   DATA REVIEW:   CBC Recent Labs  Lab 09/08/17 0413  WBC 11.9*  HGB 10.4*  HCT 32.0*  PLT 421    Chemistries  Recent Labs  Lab 09/08/17 0413  NA 141  K 3.3*  CL 107  CO2 26  GLUCOSE 107*  BUN 15  CREATININE 0.85  CALCIUM 8.0*    Cardiac Enzymes No results for input(s): TROPONINI in the last 168 hours.  Microbiology Results  Results for orders placed or performed during the hospital encounter of 09/04/17  Blood culture (routine x 2)     Status: None   Collection Time: 09/04/17 11:54 PM  Result Value Ref Range Status   Specimen Description BLOOD LEFT ANTECUBITAL  Final   Special Requests   Final    BOTTLES DRAWN AEROBIC AND ANAEROBIC Blood Culture adequate volume   Culture   Final    NO GROWTH 5 DAYS Performed at [facility], [address], [place], [place] [address]    Report Status 09/10/2017 FINAL  Final  Blood culture (routine x 2)     Status: None   Collection Time: 09/04/17 11:55 PM  Result Value Ref Range Status   Specimen Description BLOOD BLOOD LEFT ARM  Final   Special Requests   Final    BOTTLES DRAWN AEROBIC AND ANAEROBIC Blood Culture adequate volume   Culture   Final    NO GROWTH 5 DAYS Performed at [facility], [address], [place], [place] [address]    Report Status 09/10/2017 FINAL  Final  Culture, expectorated sputum-assessment     Status: None   Collection Time:  09/06/17  7:52 PM  Result Value Ref Range Status   Specimen Description EXPECTORATED SPUTUM  Final   Special Requests Normal  Final   Sputum evaluation   Final    THIS SPECIMEN IS ACCEPTABLE FOR SPUTUM CULTURE Performed at [facility], [address], [place], [place] [address]    Report Status 09/06/2017 FINAL  Final  Culture, respiratory (NON-Expectorated)     Status: None   Collection Time: 09/06/17  7:52 PM  Result Value Ref Range Status   Specimen Description   Final    EXPECTORATED SPUTUM Performed at [facility], [address] [place], [place] [address]    Special Requests   Final  Normal Reflexed from [id number] Performed at [facility], [address] [place], [place] [address]    Gram Stain   Final    RARE WBC PRESENT, PREDOMINANTLY PMN RARE GRAM POSITIVE COCCI RARE GRAM POSITIVE RODS    Culture   Final    Consistent with normal respiratory flora. Performed at [facility], [place] [address] [place], [place] [address]    Report Status 09/09/2017 FINAL  Final    RADIOLOGY:  No results found.  EKG:   Orders placed or performed during the hospital encounter of 09/04/17  . ED EKG  . ED EKG  . EKG 12-Lead  . EKG 12-Lead  . EKG 12-Lead  . EKG 12-Lead      Management plans discussed with the patient, family and they are in agreement.  CODE STATUS:     Code Status Orders  (From admission, onward)        Start     Ordered   09/13/17 1050  Do not attempt resuscitation (DNR)  Continuous    Question Answer Comment  In the event of cardiac or respiratory ARREST Do not call a "code blue"   In the event of cardiac or respiratory ARREST Do not perform Intubation, CPR, defibrillation or ACLS   In the event of cardiac or respiratory ARREST Use medication by any route, position, wound care, and other measures to relive pain and suffering. May use oxygen, suction and manual treatment of airway obstruction as needed for  comfort.      09/13/17 1049    Code Status History    Date Active Date Inactive Code Status Order ID Comments User Context   09/05/2017 03:00 09/13/2017 10:49 Full Code [id number]  Amelia Jo, MD Inpatient      TOTAL TIME TAKING CARE OF THIS PATIENT: 45 minutes.    Avel Peace Myria Steenbergen M.D on 09/14/2017 at 9:43 AM  Between 7am to 6pm - Pager - [phone]  After 6pm go to [email or web address] - password [email or web address] [facility]  Office  [phone]  CC: Primary care physician; Letta Median, MD   Note: This dictation was prepared with Dragon dictation along with smaller phrase technology. Any transcriptional errors that result from this process are unintentional.

## 2017-09-15 ENCOUNTER — Telehealth: Payer: Self-pay | Admitting: *Deleted

## 2017-09-15 LAB — GLUCOSE, CAPILLARY: Glucose-Capillary: 160 mg/dL — ABNORMAL HIGH (ref 65–99)

## 2017-09-15 MED ORDER — TUBERCULIN PPD 5 UNIT/0.1ML ID SOLN
5.0000 [IU] | Freq: Once | INTRADERMAL | Status: DC
  Filled 2017-09-15: qty 0.1

## 2017-09-15 MED ORDER — LACTULOSE ENEMA
300.0000 mL | Freq: Once | ORAL | Status: DC
  Filled 2017-09-15: qty 300

## 2017-09-15 NOTE — Care Management (Addendum)
Contacted [employer] and informed that patient's residence does have current service and their is no indication that there are concerns regarding cut off.  Have placed call to [facility] to discuss concerns appear more urgent now and would appreciate prompt intervention rather than delaying  referral.  Have left message for CM Director to review case.

## 2017-09-15 NOTE — Progress Notes (Addendum)
Stacy Zhang to be D/C'd Home per MD order.  Discussed prescriptions and follow up appointments with the patient. Prescriptions given to patient, medication list explained in detail. Pt verbalized understanding.  Allergies as of 09/15/2017   No Known Allergies     Medication List    STOP taking these medications   sulfamethoxazole-trimethoprim 800-160 MG tablet Commonly known as:  BACTRIM DS,SEPTRA DS     TAKE these medications   ALPRAZolam 0.5 MG tablet Commonly known as:  XANAX Take 1 tablet (0.5 mg total) by mouth 2 (two) times daily.   budesonide-formoterol 160-4.5 MCG/ACT inhaler Commonly known as:  SYMBICORT Inhale 2 puffs into the lungs 2 (two) times daily.   COMBIVENT RESPIMAT 20-100 MCG/ACT Aers respimat Generic drug:  Ipratropium-Albuterol Inhale 2 puffs into the lungs 2 (two) times daily.   diltiazem 120 MG 24 hr capsule Commonly known as:  CARDIZEM CD Take 1 capsule (120 mg total) by mouth daily.   docusate sodium 100 MG capsule Commonly known as:  COLACE Take 1 capsule (100 mg total) by mouth 2 (two) times daily.   feeding supplement (ENSURE ENLIVE) Liqd Take 237 mLs by mouth 3 (three) times daily between meals.   fluticasone 50 MCG/ACT nasal spray Commonly known as:  FLONASE Place 1 spray into both nostrils daily.   guaiFENesin 600 MG 12 hr tablet Commonly known as:  MUCINEX Take 1 tablet (600 mg total) by mouth 2 (two) times daily.   HYDROcodone-acetaminophen 5-325 MG tablet Commonly known as:  NORCO/VICODIN Take 1-2 tablets by mouth every 4 (four) hours as needed for moderate pain.   megestrol 400 MG/10ML suspension Commonly known as:  MEGACE Take 10 mLs (400 mg total) by mouth daily.   nystatin 100000 UNIT/ML suspension Commonly known as:  MYCOSTATIN Take 5 mLs (500,000 Units total) by mouth 4 (four) times daily.   pantoprazole 40 MG tablet Commonly known as:  PROTONIX Take 1 tablet (40 mg total) by mouth daily.   predniSONE 50 MG  tablet Commonly known as:  DELTASONE 1 po daily   valACYclovir 1000 MG tablet Commonly known as:  VALTREX Take 1 tablet (1,000 mg total) by mouth 3 (three) times daily.            Durable Medical Equipment  (From admission, onward)        Start     Ordered   09/13/17 1409  For home use only DME oxygen  Once    Question Answer Comment  Mode or (Route) Nasal cannula   Liters per Minute 2   Frequency Continuous (stationary and portable oxygen unit needed)   Oxygen conserving device Yes   Oxygen delivery system Gas      09/13/17 1409      Vitals:   09/15/17 1425 09/15/17 1510  BP:  (!) 128/58  Pulse:  (!) 110  Resp:  18  Temp:  98.8 F (37.1 C)  SpO2: 95% 94%    IV and tele box were removed yesterday as patient was supposed to be discharged.Pt denies pain at this time. No complaints noted.   Patient escorted home via strecher by [facility]  Rolley Sims

## 2017-09-15 NOTE — Telephone Encounter (Signed)
Patient discharging form hospital and they received a referral for Palliative Care. Asking if Dr Stephens November will sign orders for [facility]

## 2017-09-15 NOTE — Progress Notes (Signed)
Pt up for d/c per MD order, [facility]  Has been called for transport.

## 2017-09-15 NOTE — Telephone Encounter (Signed)
Yes. She refused treatment from what I can tell. She was interested in consulting with radiation oncology. Palliative care would be a good option, with transition to hospice services in the near future.   Gaspar Bidding

## 2017-09-15 NOTE — Care Management (Signed)
Received call from [place] from [facility] requesting information about why staff concerned about patient discharging home. Explained this information has been addressed in the referral and with Randi.  She relays that the case is actually Hers and Lala Lund was just providing assist. Updated CM director on issues surrounding discharge.

## 2017-09-15 NOTE — Discharge Summary (Signed)
[facility] [place] NAME: Stacy Zhang    MR#:  [id number]  DATE OF BIRTH:  09/04/1950  DATE OF ADMISSION:  09/04/2017 ADMITTING PHYSICIAN: Amelia Jo, MD  DATE OF DISCHARGE: No discharge date for patient encounter.  PRIMARY CARE PHYSICIAN: Letta Median, MD    ADMISSION DIAGNOSIS:  Acute respiratory failure with hypoxia ([place]) [J96.01] Multifocal pneumonia [J18.9]  DISCHARGE DIAGNOSIS:  Active Problems:   Sepsis ([place])   Protein-calorie malnutrition, severe   Gross hematuria   Urinary retention   SECONDARY DIAGNOSIS:   Past Medical History:  Diagnosis Date  . COPD (chronic obstructive pulmonary disease) [facility])     HOSPITAL COURSE:  1acuteon chronichypoxic respiratory failure Resolved Secondary to bilateral pneumonia and acute on COPD exacerbation  Stable  Treated with course of IV antibiotics while in house,for home oxygen L continous at home,CT chest negative for cancer  2acute septicemia  Resolved  Secondary to pneumonia  3acute on chronic obstructive pulmonary disease exacerbation  Resolving Treated withIV Solu-Medrol while in house,inhaled corticosteroids twice daily, BTs, and pt did well  4newly diagnosed incurable metastatic cervical cancer with metastases to liver/bone  S/pcervical bx byOB/GYN -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 Corcoran/Oncologydid see patient while in house, will need to follow-up status post discharge for continued care regarding treatment options-plan is for palliative chemotherapy Radiation oncology did see patient in house as well-plans for palliative radiation therapy status post discharge Palliative care did see patient while in houseand will be continued at home  5 acute urinary retention Resolved  Discharge delayed by another day for concern regarding possible lack of electricity -electricity is working in the home  DISCHARGE  CONDITIONS:  Stable condition, prognosis is terminal, for palliative care at home CONSULTS OBTAINED:  Treatment Team:  Hollice Espy, MD Schermerhorn, Gwen Her, MD Noreene Filbert, MD  DRUG ALLERGIES:  No Known Allergies  DISCHARGE MEDICATIONS:   Allergies as of 09/15/2017   No Known Allergies     Medication List    STOP taking these medications   sulfamethoxazole-trimethoprim 800-160 MG tablet Commonly known as:  BACTRIM DS,SEPTRA DS     TAKE these medications   ALPRAZolam 0.5 MG tablet Commonly known as:  XANAX Take 1 tablet (0.5 mg total) by mouth 2 (two) times daily.   budesonide-formoterol 160-4.5 MCG/ACT inhaler Commonly known as:  SYMBICORT Inhale 2 puffs into the lungs 2 (two) times daily.   COMBIVENT RESPIMAT 20-100 MCG/ACT Aers respimat Generic drug:  Ipratropium-Albuterol Inhale 2 puffs into the lungs 2 (two) times daily.   diltiazem 120 MG 24 hr capsule Commonly known as:  CARDIZEM CD Take 1 capsule (120 mg total) by mouth daily.   docusate sodium 100 MG capsule Commonly known as:  COLACE Take 1 capsule (100 mg total) by mouth 2 (two) times daily.   feeding supplement (ENSURE ENLIVE) Liqd Take 237 mLs by mouth 3 (three) times daily between meals.   fluticasone 50 MCG/ACT nasal spray Commonly known as:  FLONASE Place 1 spray into both nostrils daily.   guaiFENesin 600 MG 12 hr tablet Commonly known as:  MUCINEX Take 1 tablet (600 mg total) by mouth 2 (two) times daily.   HYDROcodone-acetaminophen 5-325 MG tablet Commonly known as:  NORCO/VICODIN Take 1-2 tablets by mouth every 4 (four) hours as needed for moderate pain.   megestrol 400 MG/10ML suspension Commonly known as:  MEGACE Take 10 mLs (400 mg total) by mouth daily.   nystatin 100000  UNIT/ML suspension Commonly known as:  MYCOSTATIN Take 5 mLs (500,000 Units total) by mouth 4 (four) times daily.   pantoprazole 40 MG tablet Commonly known as:  PROTONIX Take 1 tablet (40 mg total)  by mouth daily.   predniSONE 50 MG tablet Commonly known as:  DELTASONE 1 po daily   valACYclovir 1000 MG tablet Commonly known as:  VALTREX Take 1 tablet (1,000 mg total) by mouth 3 (three) times daily.            Durable Medical Equipment  (From admission, onward)        Start     Ordered   09/13/17 1409  For home use only DME oxygen  Once    Question Answer Comment  Mode or (Route) Nasal cannula   Liters per Minute 2   Frequency Continuous (stationary and portable oxygen unit needed)   Oxygen conserving device Yes   Oxygen delivery system Gas      09/13/17 1409       DISCHARGE INSTRUCTIONS:   If you experience worsening of your admission symptoms, develop shortness of breath, life threatening emergency, suicidal or homicidal thoughts you must seek medical attention immediately by calling 911 or calling your MD immediately  if symptoms less severe.  You Must read complete instructions/literature along with all the possible adverse reactions/side effects for all the Medicines you take and that have been prescribed to you. Take any new Medicines after you have completely understood and accept all the possible adverse reactions/side effects.   Please note  You were cared for by a hospitalist during your hospital stay. If you have any questions about your discharge medications or the care you received while you were in the hospital after you are discharged, you can call the unit and asked to speak with the hospitalist on call if the hospitalist that took care of you is not available. Once you are discharged, your primary care physician will handle any further medical issues. Please note that NO REFILLS for any discharge medications will be authorized once you are discharged, as it is imperative that you return to your primary care physician (or establish a relationship with a primary care physician if you do not have one) for your aftercare needs so that they can reassess your  need for medications and monitor your lab values.    Today   CHIEF COMPLAINT:   Chief Complaint  Patient presents with  . Shortness of Breath  . Respiratory Distress    HISTORY OF PRESENT ILLNESS:  67 y.o.femalewith a known history of COPD,not on home oxygen.Patient is a former smoker,she quit cigarettes 20 years ago. Shecame to emergency room via EMS for acute onset of shortness of breath and productive cough started in the past 24-48 hours.She used her nebulizer treatment,at home every 3 hours,without much improvement.Patient feels very weak,exhausted.Patient has left-sided chest pain,under the ribs with cough and deep inspiration.She says she felthot,but she did not check her temperature,at home.She denies any nausea, vomiting, diarrhea or bleeding. Upon evaluation in the emergency room,patient was found to be hypoxemic.She currently requires 3.5L per nasal cannula to maintain 94% oxygen saturation.Temperature was 100.3 and heart rate still is in 120s.Blood tests are remarkable for elevated WBC at 37,000 and elevated lactic acid at 2.2.Troponin level is 0.03.CAT scan of the chest is negative for any PE but confirms bilateral pneumonia. Patient is admitted for further evaluation and treatment  VITAL SIGNS:  Blood pressure 126/60, pulse (!) 109, temperature 98.4 F (36.9 C),  temperature source Oral, resp. rate 18, height 5\' 2"  (1.575 m), weight 38.7 kg (85 lb 4.8 oz), SpO2 97 %.  I/O:    Intake/Output Summary (Last 24 hours) at 09/15/2017 1213 Last data filed at 09/15/2017 1100 Gross per 24 hour  Intake 0 ml  Output 800 ml  Net -800 ml    PHYSICAL EXAMINATION:  GENERAL:  67 y.o.-year-old patient lying in the bed with no acute distress.  EYES: Pupils equal, round, reactive to light and accommodation. No scleral icterus. Extraocular muscles intact.  HEENT: Head atraumatic, normocephalic. Oropharynx and nasopharynx clear.  NECK:  Supple, no jugular  venous distention. No thyroid enlargement, no tenderness.  LUNGS: Normal breath sounds bilaterally, no wheezing, rales,rhonchi or crepitation. No use of accessory muscles of respiration.  CARDIOVASCULAR: S1, S2 normal. No murmurs, rubs, or gallops.  ABDOMEN: Soft, non-tender, non-distended. Bowel sounds present. No organomegaly or mass.  EXTREMITIES: No pedal edema, cyanosis, or clubbing.  NEUROLOGIC: Cranial nerves II through XII are intact. Muscle strength 5/5 in all extremities. Sensation intact. Gait not checked.  PSYCHIATRIC: The patient is alert and oriented x 3.  SKIN: No obvious rash, lesion, or ulcer.   DATA REVIEW:   CBC No results for input(s): WBC, HGB, HCT, PLT in the last 168 hours.  Chemistries  No results for input(s): NA, K, CL, CO2, GLUCOSE, BUN, CREATININE, CALCIUM, MG, AST, ALT, ALKPHOS, BILITOT in the last 168 hours.  Invalid input(s): GFRCGP  Cardiac Enzymes No results for input(s): TROPONINI in the last 168 hours.  Microbiology Results  Results for orders placed or performed during the hospital encounter of 09/04/17  Blood culture (routine x 2)     Status: None   Collection Time: 09/04/17 11:54 PM  Result Value Ref Range Status   Specimen Description BLOOD LEFT ANTECUBITAL  Final   Special Requests   Final    BOTTLES DRAWN AEROBIC AND ANAEROBIC Blood Culture adequate volume   Culture   Final    NO GROWTH 5 DAYS Performed at [facility], [address], [place], [place] [address]    Report Status 09/10/2017 FINAL  Final  Blood culture (routine x 2)     Status: None   Collection Time: 09/04/17 11:55 PM  Result Value Ref Range Status   Specimen Description BLOOD BLOOD LEFT ARM  Final   Special Requests   Final    BOTTLES DRAWN AEROBIC AND ANAEROBIC Blood Culture adequate volume   Culture   Final    NO GROWTH 5 DAYS Performed at [facility], [address] [place], [place] [address]    Report Status 09/10/2017 FINAL  Final   Culture, expectorated sputum-assessment     Status: None   Collection Time: 09/06/17  7:52 PM  Result Value Ref Range Status   Specimen Description EXPECTORATED SPUTUM  Final   Special Requests Normal  Final   Sputum evaluation   Final    THIS SPECIMEN IS ACCEPTABLE FOR SPUTUM CULTURE Performed at [facility], [address] [place], [place] [address]    Report Status 09/06/2017 FINAL  Final  Culture, respiratory (NON-Expectorated)     Status: None   Collection Time: 09/06/17  7:52 PM  Result Value Ref Range Status   Specimen Description   Final    EXPECTORATED SPUTUM Performed at [facility], [address], [place],  [address]    Special Requests   Final    Normal Reflexed from [id number] Performed at [facility], [address] [place], [place] [address]    Gram Stain   Final    RARE WBC PRESENT, PREDOMINANTLY PMN RARE GRAM POSITIVE COCCI RARE GRAM POSITIVE RODS    Culture   Final    Consistent with normal respiratory flora. Performed at [facility], [place] [address], [place], [place] [address]    Report Status 09/09/2017 FINAL  Final    RADIOLOGY:  No results found.  EKG:   Orders placed or performed during the hospital encounter of 09/04/17  . ED EKG  . ED EKG  . EKG 12-Lead  . EKG 12-Lead  . EKG 12-Lead  . EKG 12-Lead      Management plans discussed with the patient, family and they are in agreement.  CODE STATUS:     Code Status Orders  (From admission, onward)        Start     Ordered   09/13/17 1050  Do not attempt resuscitation (DNR)  Continuous    Question Answer Comment  In the event of cardiac or respiratory ARREST Do not call a "code blue"   In the event of cardiac or respiratory ARREST Do not perform Intubation, CPR, defibrillation or ACLS   In the event of cardiac or respiratory ARREST Use medication by any route, position, wound care, and other measures to relive pain and suffering. May use  oxygen, suction and manual treatment of airway obstruction as needed for comfort.      09/13/17 1049    Code Status History    Date Active Date Inactive Code Status Order ID Comments User Context   09/05/2017 03:00 09/13/2017 10:49 Full Code [id number]  Amelia Jo, MD Inpatient      TOTAL TIME TAKING CARE OF THIS PATIENT: 35 minutes.    Avel Peace Salary M.D on 09/15/2017 at 12:13 PM  Between 7am to 6pm - Pager - [phone]  After 6pm go to [email or web address] - password [email or web address]  Office  [phone]  CC: Primary care physician; Letta Median, MD   Note: This dictation was prepared with Dragon dictation along with smaller phrase technology. Any transcriptional errors that result from this process are unintentional.

## 2017-09-15 NOTE — Care Management (Signed)
patient declined going to assisted living again.  Will move forward with discharge home via ems.  Patient has keys to her home and seen by CM. CM contacted [facility] to inform going forward with discharge plan home.  Reiterated that this is patient's decision and even if things are less than ideal at home, the patient has made the decision to discharge home.  Notified APS, palliative and [facility].

## 2017-09-15 NOTE — Telephone Encounter (Signed)
Proceed with palliative consult.   Stacy Zhang

## 2017-09-15 NOTE — Care Management (Signed)
CM was contacted by primary nurse last night to relay EMS transport team concerns.  Informed that could not take patient's oxygen because it was a concentrator and plugged in.  CM explained it was a portable concentrator that only needed to be plugged in to recharge.  Further explained that CM had contacted [facility] and informed that equipment would be allowed on the truck.   It was verbalized by EMS team that patient had "too much stuff" to transport.  Patient then verbalized "I hope I have lights and running water."  Patient had not verbalized this concern to any team member prior to this. She says she thinks the power is in [place]'s name- she is not sure. "He is suppose to pay it and I do not know if he did.  She does not know any of her neighbors so none of them can check.  She does not know the name of her landlord.  She pays cash for her rent- then says sometimes she pays the rent with money order.  CM had to press for name that is placed on money order and patient said "I think it is to [employer]."

## 2017-09-15 NOTE — Progress Notes (Signed)
Nutrition Follow Up Note   DOCUMENTATION CODES:   Severe malnutrition in context of chronic illness  INTERVENTION:   Ensure Enlive po TID, each supplement provides 350 kcal and 20 grams of protein  Magic cup TID with meals, each supplement provides 290 kcal and 9 grams of protein  MVI daily  NUTRITION DIAGNOSIS:   Severe Malnutrition related to catabolic illness(COPD) as evidenced by severe fat depletion, severe muscle depletion.  GOAL:   Patient will meet greater than or equal to 90% of their needs  MONITOR:   PO intake, Supplement acceptance, Weight trends, Labs, I & O's  ASSESSMENT:    67 y.o. 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 Pt being followed by palliative care and will continue to be followed by them at home. Pt to discharge yesterday but discharge was delayed until today. Pt weight stable since admit. Can provide supplements if pt requests. Liberal diet.    Medications reviewed and include: colace, megace, solu-medrol, MVI, protonix  Labs reviewed: no new labs   Diet Order:  Diet regular Room service appropriate? Yes; Fluid consistency: Thin  EDUCATION NEEDS:   Education needs have been addressed  Skin:  Reviewed RN Assessment  Last BM:  2/10- type 3  Height:   Ht Readings from Last 1 Encounters:  09/05/17 5\' 2"  (1.575 m)    Weight:   Wt Readings from Last 1 Encounters:  09/15/17 85 lb 4.8 oz (38.7 kg)    Ideal Body Weight:  50 kg  BMI:  Body mass index is 15.6 kg/m.  Estimated Nutritional Needs:   Kcal:  1200-1400kcal/day   Protein:  59-66g/day   Fluid:  >1L/day   Koleen Distance MS, RD, LDN Pager #[phone] After Hours Pager: [phone]

## 2017-09-15 NOTE — Progress Notes (Signed)
   09/15/17 1650  Clinical Encounter Type  Visited With Patient  Visit Type Follow-up  Referral From Physician;Social work;Care management  Consult/Referral To Chaplain  Spiritual Encounters  Spiritual Needs Prayer;Emotional;Grief support   [facility] visited with PT to check in with her. PT was being discharged and [place] stayed to help EMT's carry her things to ambulance. PT was nervous about going home. PT will be back on Thursday for check up and [facility] will follow up then.

## 2017-09-15 NOTE — Care Management (Signed)
Spoke with stacy at ems to inform of concerns regarding last night attempted transport.  Informed again that EMS can take the portable oxygen concentrator , walker and a bag of personal belongings.  Hollace Kinnier from [facility] has met with patient.  Lala Lund has called Clancy Gourd from [place] to meet with patient to see if can persuade her to go to assisted living from here. Updated attending.  Patient will discharge today either to  home (most likely) or to ALF.

## 2017-09-16 ENCOUNTER — Telehealth: Payer: Self-pay

## 2017-09-16 NOTE — Telephone Encounter (Signed)
She has no follow up appointment with you, she is getting radiation therapy and no GYN appointment today. Do you want to see her? When?

## 2017-09-16 NOTE — Telephone Encounter (Signed)
  We will see her on a day she comes for radiation.  Coordinate times.  I believe Ellison Hughs is working on an appointment for Friday.  M

## 2017-09-16 NOTE — Telephone Encounter (Signed)
  We should see her in clinic to confirm her plans.  She should have an appointment with GYN ONC today.  M

## 2017-09-16 NOTE — Telephone Encounter (Signed)
Called x2 to attempt to arrange for patient to be seen today in [place]. Voicemail is full. Oncology Nurse Navigator Documentation  Navigator Location: [facility] (09/16/17 1000)   )Navigator Encounter Type: Telephone (09/16/17 1000)                     Patient Visit Type: GynOnc (09/16/17 1000)                              Time Spent with Patient: 15 (09/16/17 1000)

## 2017-09-16 NOTE — NC FL2 (Signed)
[place] LEVEL OF CARE SCREENING TOOL     IDENTIFICATION  Patient Name: Stacy Zhang Birthdate: 02/24/1951 Sex: female Admission Date (Current Location): 09/04/2017  [place] and [place] Number:  Engineering geologist and Address:  [facility], [address], [place], [place] [address]      Provider Number: [id number]  Attending Physician Name and Address:   Gladstone Lighter, MD Relative Name and Phone Number:  Ann Held Significant other [phone]     Current Level of Care: Hospital Recommended Level of Care: [place] Prior Approval Number:    Date Approved/Denied:   PASRR Number:    Discharge Plan: Domiciliary (Rest home)    Current Diagnoses: Patient Active Problem List   Diagnosis Date Noted  . Protein-calorie malnutrition, severe 09/08/2017  . Gross hematuria   . Urinary retention   . Sepsis ([place]) 09/05/2017    Orientation RESPIRATION BLADDER Height & Weight     Self, Time, Situation, Place  O2(2L) Continent Weight: 85 lb 4.8 oz (38.7 kg) Height:  5\' 2"  (157.5 cm)  BEHAVIORAL SYMPTOMS/MOOD NEUROLOGICAL BOWEL NUTRITION STATUS      Continent Diet, Supplemental(Regular)  AMBULATORY STATUS COMMUNICATION OF NEEDS Skin   Limited Assist Verbally Normal                       Personal Care Assistance Level of Assistance  Bathing, Feeding, Dressing Bathing Assistance: Limited assistance Feeding assistance: Independent Dressing Assistance: Limited assistance     Functional Limitations Info  Sight, Hearing, Speech Sight Info: Adequate Hearing Info: Adequate Speech Info: Adequate    SPECIAL CARE FACTORS FREQUENCY  PT (By licensed PT)     PT Frequency: minimum 2x a week              Contractures Contractures Info: Not present    Additional Factors Info  Code Status, Allergies, Psychotropic Code Status Info: DNR Allergies Info: NKA Psychotropic Info: traZODone (DESYREL) tablet 25  mg          Current Medications (09/16/2017):  This is the current hospital active medication list No current facility-administered medications for this encounter.    Current Outpatient Medications  Medication Sig Dispense Refill  . COMBIVENT RESPIMAT 20-100 MCG/ACT AERS respimat Inhale 2 puffs into the lungs 2 (two) times daily.    . fluticasone (FLONASE) 50 MCG/ACT nasal spray Place 1 spray into both nostrils daily.    [place] Kitchen ALPRAZolam (XANAX) 0.5 MG tablet Take 1 tablet (0.5 mg total) by mouth 2 (two) times daily. 20 tablet 0  . budesonide-formoterol (SYMBICORT) 160-4.5 MCG/ACT inhaler Inhale 2 puffs into the lungs 2 (two) times daily. 1 Inhaler 12  . diltiazem (CARDIZEM CD) 120 MG 24 hr capsule Take 1 capsule (120 mg total) by mouth daily. 90 capsule 0  . docusate sodium (COLACE) 100 MG capsule Take 1 capsule (100 mg total) by mouth 2 (two) times daily. 10 capsule 0  . feeding supplement, ENSURE ENLIVE, (ENSURE ENLIVE) LIQD Take 237 mLs by mouth 3 (three) times daily between meals. 237 mL 12  . guaiFENesin (MUCINEX) 600 MG 12 hr tablet Take 1 tablet (600 mg total) by mouth 2 (two) times daily. 30 tablet 0  . HYDROcodone-acetaminophen (NORCO/VICODIN) 5-325 MG tablet Take 1-2 tablets by mouth every 4 (four) hours as needed for moderate pain. 30 tablet 0  . megestrol (MEGACE) 400 MG/10ML suspension Take 10 mLs (400 mg total) by mouth daily. 240 mL 0  . nystatin (  MYCOSTATIN) 100000 UNIT/ML suspension Take 5 mLs (500,000 Units total) by mouth 4 (four) times daily. 60 mL 0  . pantoprazole (PROTONIX) 40 MG tablet Take 1 tablet (40 mg total) by mouth daily. 30 tablet 0  . predniSONE (DELTASONE) 50 MG tablet 1 po daily 7 tablet 0  . valACYclovir (VALTREX) 1000 MG tablet Take 1 tablet (1,000 mg total) by mouth 3 (three) times daily. (Patient not taking: Reported on 09/04/2017) 30 tablet 0     Discharge Medications: Please see discharge summary for a list of discharge medications.  Relevant Imaging  Results:  Relevant Lab Results:   Additional Information SSN [id number]  Ross Ludwig, [place]

## 2017-09-17 ENCOUNTER — Telehealth: Payer: Self-pay | Admitting: *Deleted

## 2017-09-17 ENCOUNTER — Telehealth: Payer: Self-pay | Admitting: Urgent Care

## 2017-09-17 ENCOUNTER — Ambulatory Visit: Payer: Medicare Other

## 2017-09-17 LAB — SURGICAL PATHOLOGY

## 2017-09-17 NOTE — Telephone Encounter (Signed)
Call placed to [facility] to discuss oxygen needs. Patient missed XRT today due to not having portable oxygen source. Order faxed to [facility] asking for STAT delivery of oxygen equipment today. Home care representative aware that patient Stacy Zhang is picking up patient at 0830am, and advises that he will ensure that patient has oxygen for transport.

## 2017-09-17 NOTE — Telephone Encounter (Signed)
Patient refuses Palliative Care Consultation

## 2017-09-17 NOTE — Telephone Encounter (Signed)
Spoke with [facility]. Orders faxed over. She should have oxygen equipment today. They are aware of that patient will be picked up in the morning at 0830 for treatments.

## 2017-09-17 NOTE — Telephone Encounter (Signed)
Patient unable to come in for radiation therapy today because when Stacy Zhang driver went to get her, she does NOT have portable O2. She only has home O2. She has appointment tomorrow with [facility], but she is on [place] again. Please see if portable O2 can be set portable O2 can be set up ASAP

## 2017-09-18 ENCOUNTER — Encounter: Payer: Self-pay | Admitting: Hematology and Oncology

## 2017-09-18 ENCOUNTER — Ambulatory Visit: Payer: Medicare Other

## 2017-09-18 ENCOUNTER — Inpatient Hospital Stay: Payer: Medicare Other | Attending: Hematology and Oncology | Admitting: Hematology and Oncology

## 2017-09-18 ENCOUNTER — Other Ambulatory Visit: Payer: Self-pay | Admitting: *Deleted

## 2017-09-18 DIAGNOSIS — C7952 Secondary malignant neoplasm of bone marrow: Principal | ICD-10-CM

## 2017-09-18 DIAGNOSIS — C7951 Secondary malignant neoplasm of bone: Secondary | ICD-10-CM

## 2017-09-18 DIAGNOSIS — C539 Malignant neoplasm of cervix uteri, unspecified: Secondary | ICD-10-CM | POA: Insufficient documentation

## 2017-09-18 NOTE — Progress Notes (Deleted)
[facility] day:  09/18/2017  Chief Complaint: EDWYNA Zhang is a 67 y.o. female with metastatic cervical cancer who is seen for assessment after recent hospitalization.  HPI:  The patient was admitted to [facility] from 09/05/2017 - 09/15/2017 with pneumonia.  She presented on shortness of breath and cough for 2 days.  White count was 37,000.  CT angiogram on 09/05/2017 revealed no evidence of pulmonary embolism but bilateral posterior basilar opacities concerning for pneumonia or atelectasis.  There was a 6 mm subpleural nodule in the right upper lobe.  She was treated with azithromycin and ceftriaxone.  She describes a 2-week history of fatigue and a 25 pound weight loss in 2 years.  She has never had a mammogram, colonoscopy or pelvic exam (until this admission).  She had one child (unsure of when she was born).  She became post-menopausal "years ago".  She described vaginal bleeding on and off for 2 1/2 years.  During admission, she noted difficulty voiding.  She had a Foley catheter placed with difficulty.  She states that "blood came out" (unsure if vaginal bleeding or hematuria).  She described back and hip pain.  Hip films on 09/08/2017 revealed moth-eaten appearance of the superior and inferior pubic rami on the right likely related to metastatic disease.   Chest, abdomen, and pelvic CT on 09/08/2017 revealed a large macrolobulated irregular heterogeneous enhancing soft tissue mass centered at the cervix/lower uterine segment measuring 7.6 x 6.2 x 5.4 cm in size most favoring a cervical neoplasm though endometrial neoplasm and vaginal neoplasm were not excluded.  There was direct extension of tumor to involve the vagina and posterior bladder with obstruction of the the LEFT ureter and marked LEFT hydroureteronephrosis and impaired LEFT renal function, as well as cervical obstruction with hydrometrocolpos.  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  There  was extensive osseous metastatic lesion involving the anterior column and medial wall of the RIGHT acetabulum as well as the RIGHT pubic body and RIGHT superior/inferior pubic rami.  There was pelvic adenopathy.  There were BILATERAL pleural effusions with compressive atelectasis and question minimal infiltrate in lower lobes.  She was seen by Dr. Hollice Espy.  There was felt limited benefit of left renal drainage secondary to the degree of renal atrophy, normal creatinine, and her being asymptomatic from the obstruction.  She underwent cervical biopsy on 09/09/2017.  Pathology confirmed invasive squamous cell cancer.  We discussed treatment is palliative. She declined looking at images. She is not a surgical candidate. We discussed palliative chemotherapy (carboplatin + Taxol and +/- Avastin).  Response rates were estimated at 35-40%.  Single agent chemotherapy response rates were estimated at 19-20%.  She may be more appropriate for weekly therapy to assess tolerance given her poor performance status.  We discussed palliative radiation (discussed at tumor board).  She was interested in taking to Dr. Baruch Gouty.  We discussed supportive care/Hospice. She was considering all of her options at discahrge, but is leaning toward initial radiation and possible chemotherapy in the future.   During her admission, we also discussed the possibility of second line pembrolizumab (response rates 17%).  Mismatch repair/microsatellite instability and PDL-1 to be sent on the tumor.    She was seen by Dr. Baruch Gouty during her admission.  He recommended palliative radiation therapy to her pelvis including her bone metastasis in the right acetabulum and symphysis pubic region. Plan was to deliver 4000 cGy in 20 fractions. This could be performed along  with chemotherapy   Symptomatically,    Past Medical History:  Diagnosis Date  . COPD (chronic obstructive pulmonary disease) (HCC)     No past surgical history  on file.  No family history on file.  Social History:  reports that she has quit smoking. she has never used smokeless tobacco. She reports that she does not drink alcohol. Her drug history is not on file.  She previously smoked 2 packs/day.  She quit smoking 30 years ago.  She denies any exposure to radiation or toxins.  She has lived alone x a few months.  She previously had a boyfriend of 20 years.  She does not know where her daughter lives.  She has no medical power of attorney.  The patient is accompanied by *** alone today.  Allergies: No Known Allergies  Current Medications: Current Outpatient Medications  Medication Sig Dispense Refill  . ALPRAZolam (XANAX) 0.5 MG tablet Take 1 tablet (0.5 mg total) by mouth 2 (two) times daily. 20 tablet 0  . budesonide-formoterol (SYMBICORT) 160-4.5 MCG/ACT inhaler Inhale 2 puffs into the lungs 2 (two) times daily. 1 Inhaler 12  . COMBIVENT RESPIMAT 20-100 MCG/ACT AERS respimat Inhale 2 puffs into the lungs 2 (two) times daily.    Marland Kitchen diltiazem (CARDIZEM CD) 120 MG 24 hr capsule Take 1 capsule (120 mg total) by mouth daily. 90 capsule 0  . docusate sodium (COLACE) 100 MG capsule Take 1 capsule (100 mg total) by mouth 2 (two) times daily. 10 capsule 0  . feeding supplement, ENSURE ENLIVE, (ENSURE ENLIVE) LIQD Take 237 mLs by mouth 3 (three) times daily between meals. 237 mL 12  . fluticasone (FLONASE) 50 MCG/ACT nasal spray Place 1 spray into both nostrils daily.    Marland Kitchen guaiFENesin (MUCINEX) 600 MG 12 hr tablet Take 1 tablet (600 mg total) by mouth 2 (two) times daily. 30 tablet 0  . HYDROcodone-acetaminophen (NORCO/VICODIN) 5-325 MG tablet Take 1-2 tablets by mouth every 4 (four) hours as needed for moderate pain. 30 tablet 0  . megestrol (MEGACE) 400 MG/10ML suspension Take 10 mLs (400 mg total) by mouth daily. 240 mL 0  . nystatin (MYCOSTATIN) 100000 UNIT/ML suspension Take 5 mLs (500,000 Units total) by mouth 4 (four) times daily. 60 mL 0  .  pantoprazole (PROTONIX) 40 MG tablet Take 1 tablet (40 mg total) by mouth daily. 30 tablet 0  . predniSONE (DELTASONE) 50 MG tablet 1 po daily 7 tablet 0  . valACYclovir (VALTREX) 1000 MG tablet Take 1 tablet (1,000 mg total) by mouth 3 (three) times daily. (Patient not taking: Reported on 09/04/2017) 30 tablet 0   No current facility-administered medications for this visit.     Review of Systems:  GENERAL:  Fatigued.  Minimally active.  No fevers or sweats.  Weight loss of 25 pounds in 2 years. PERFORMANCE STATUS (ECOG):  2 HEENT:  Runny nose.  No visual changes, sore throat, mouth sores or tenderness. Lungs: Shortness of breath.  Cough.  No hemoptysis. Cardiac:  No chest pain, palpitations, orthopnea, or PND. GI:  Doesn't eat much.  Poor appetite.  Constipation.  No nausea, vomiting, diarrhea, melena or hematochezia. GU:  Difficulty voiding.  No urgency, frequency, dysuria, or hematuria.  Bleeding on/off x 2 1/2 years. Musculoskeletal:  Right hip pain radiates to back then left side.  No muscle tenderness. Extremities:  No pain or swelling. Skin:  No rashes or skin changes. Neuro:  Difficulty standing secondary to generalized weakness.  No headache, numbness or  weakness, balance or coordination issues. Endocrine:  No diabetes, thyroid issues, hot flashes or night sweats. Psych:  "Upset all of the time". Pain:  No focal pain. Review of systems:  All other systems reviewed and found to be negative.   Physical Exam: There were no vitals taken for this visit. GENERAL:  Chronically ill appearing cachectic woman lying comfortably on the medical unit in no acute distress. MENTAL STATUS:  Alert and oriented to person, place and time. HEAD:  Shoulder length brown hair.  Temporal wasting.  Normocephalic, atraumatic, face symmetric, no Cushingoid features. EYES:  Brown eyes.  Pupils equal round and reactive to light and accomodation.  No conjunctivitis or scleral icterus. ENT:  [place] in place.   Tongue ulceration.  Oropharynx clear without lesion.  Tongue normal.  Edentulous.  Mucous membranes moist.  RESPIRATORY:  Poor respiratory excursion.  Decreased breath sounds at the bases.  Clear to auscultation without rales, wheezes or rhonchi. CARDIOVASCULAR:  Regular rate and rhythm without murmur, rub or gallop. ABDOMEN:  Suprapubic discomfort.  Soft, non-tender, with active bowel sounds, and no hepatosplenomegaly.  No masses. GU:  Foley catheter in place.  Urine clear yellow. SKIN:  No rashes, ulcers or lesions. EXTREMITIES: Thin.  No edema, no skin discoloration or tenderness.  No palpable cords. LYMPH NODES: No palpable cervical, supraclavicular, axillary or inguinal adenopathy  NEUROLOGICAL: Unremarkable. PSYCH:  Appropriate.   No visits with results within 3 Day(s) from this visit.  Latest known visit with results is:  Admission on 09/04/2017, Discharged on 09/15/2017  Component Date Value Ref Range Status  . Influenza A By PCR 09/04/2017 NEGATIVE  NEGATIVE Final  . Influenza B By PCR 09/04/2017 NEGATIVE  NEGATIVE Final   Comment: (NOTE) The Xpert Xpress Flu assay is intended as an aid in the diagnosis of  influenza and should not be used as a sole basis for treatment.  This  assay is FDA approved for nasopharyngeal swab specimens only. Nasal  washings and aspirates are unacceptable for Xpert Xpress Flu testing. Performed at [facility], [address], [place], [place] [address]   . WBC 09/04/2017 37.5* 3.6 - 11.0 K/uL Final  . RBC 09/04/2017 4.25  3.80 - 5.20 MIL/uL Final  . Hemoglobin 09/04/2017 11.8* 12.0 - 16.0 g/dL Final  . HCT 09/04/2017 36.5  35.0 - 47.0 % Final  . MCV 09/04/2017 85.9  80.0 - 100.0 fL Final  . MCH 09/04/2017 27.7  26.0 - 34.0 pg Final  . MCHC 09/04/2017 32.2  32.0 - 36.0 g/dL Final  . RDW 09/04/2017 14.5  11.5 - 14.5 % Final  . Platelets 09/04/2017 432  150 - 440 K/uL Final  . Neutrophils Relative % 09/04/2017 91  % Final  . Neutro Abs  09/04/2017 34.1* 1.4 - 6.5 K/uL Final  . Lymphocytes Relative 09/04/2017 4  % Final  . Lymphs Abs 09/04/2017 1.4  1.0 - 3.6 K/uL Final  . Monocytes Relative 09/04/2017 5  % Final  . Monocytes Absolute 09/04/2017 1.9* 0.2 - 0.9 K/uL Final  . Eosinophils Relative 09/04/2017 0  % Final  . Eosinophils Absolute 09/04/2017 0.0  0 - 0.7 K/uL Final  . Basophils Relative 09/04/2017 0  % Final  . Basophils Absolute 09/04/2017 0.1  0 - 0.1 K/uL Final   Performed at [facility], [address], [place], [place] [address]  . Sodium 09/04/2017 137  135 - 145 mmol/L Final  . Potassium 09/04/2017 4.0  3.5 - 5.1 mmol/L Final  .  Chloride 09/04/2017 102  101 - 111 mmol/L Final  . CO2 09/04/2017 23  22 - 32 mmol/L Final  . Glucose, Bld 09/04/2017 110* 65 - 99 mg/dL Final  . BUN 09/04/2017 16  6 - 20 mg/dL Final  . Creatinine, Ser 09/04/2017 1.08* 0.44 - 1.00 mg/dL Final  . Calcium 09/04/2017 8.8* 8.9 - 10.3 mg/dL Final  . Total Protein 09/04/2017 7.8  6.5 - 8.1 g/dL Final  . Albumin 09/04/2017 3.0* 3.5 - 5.0 g/dL Final  . AST 09/04/2017 67* 15 - 41 U/L Final  . ALT 09/04/2017 24  14 - 54 U/L Final  . Alkaline Phosphatase 09/04/2017 106  38 - 126 U/L Final  . Total Bilirubin 09/04/2017 0.7  0.3 - 1.2 mg/dL Final  . GFR calc non Af Amer 09/04/2017 52* >60 mL/min Final  . GFR calc Af Amer 09/04/2017 >60  >60 mL/min Final   Comment: (NOTE) The eGFR has been calculated using the CKD EPI equation. This calculation has not been validated in all clinical situations. eGFR's persistently <60 mL/min signify possible Chronic Kidney Disease.   Georgiann Hahn gap 09/04/2017 12  5 - 15 Final   Performed at [facility], [address] [place], [place] [address]  . Troponin I 09/04/2017 0.03* <0.03 ng/mL Final   Comment: CRITICAL RESULT CALLED TO, READ BACK BY AND VERIFIED WITH DAJEA SCOTT AT 2257 ON 09/04/17 RWW Performed at [facility], [address] [place], [place] [address]   . pH,  Ven 09/04/2017 7.35  7.250 - 7.430 Final  . pCO2, Ven 09/04/2017 39* 44.0 - 60.0 mmHg Final  . pO2, Ven 09/04/2017 35.0  32.0 - 45.0 mmHg Final  . Bicarbonate 09/04/2017 21.5  20.0 - 28.0 mmol/L Final  . Acid-base deficit 09/04/2017 3.8* 0.0 - 2.0 mmol/L Final  . O2 Saturation 09/04/2017 63.7  % Final  . Patient temperature 09/04/2017 37.0   Final  . Collection site 09/04/2017 LINE   Final  . Sample type 09/04/2017 VENOUS   Final   Performed at [facility], [address], [place], [place] [address]  . Specimen Description 09/04/2017 BLOOD LEFT ANTECUBITAL   Final  . Special Requests 09/04/2017 BOTTLES DRAWN AEROBIC AND ANAEROBIC Blood Culture adequate volume   Final  . Culture 09/04/2017    Final                   Value:NO GROWTH 5 DAYS Performed at [facility], [address] [place], [place] [address]   . Report Status 09/04/2017 09/10/2017 FINAL   Final  . Specimen Description 09/04/2017 BLOOD BLOOD LEFT ARM   Final  . Special Requests 09/04/2017 BOTTLES DRAWN AEROBIC AND ANAEROBIC Blood Culture adequate volume   Final  . Culture 09/04/2017    Final                   Value:NO GROWTH 5 DAYS Performed at [facility], [address], [place], [place] [address]   . Report Status 09/04/2017 09/10/2017 FINAL   Final  . Lactic Acid, Venous 09/04/2017 2.2* 0.5 - 1.9 mmol/L Final   Comment: CRITICAL RESULT CALLED TO, READ BACK BY AND VERIFIED WITH DAJEA SCOTT AT 0035 ON 09/05/17 RWW Performed at [facility], [address], [place], [place] [address]   . Lactic Acid, Venous 09/05/2017 3.2* 0.5 - 1.9 mmol/L Final   Comment: CRITICAL RESULT CALLED TO, READ BACK BY AND VERIFIED WITH YASMIN SORIANO AT 0405 09/05/17.PMH  Performed at [facility], [address], [place], [place] [address]   . Troponin I 09/05/2017 0.03* <0.03 ng/mL Final   Comment: CRITICAL VALUE NOTED. VALUE IS CONSISTENT WITH PREVIOUSLY REPORTED/CALLED VALUE.PMH Performed  at [facility] [address] [place], [place] [address]   . Sodium 09/05/2017 135  135 - 145 mmol/L Final  . Potassium 09/05/2017 3.3* 3.5 - 5.1 mmol/L Final  . Chloride 09/05/2017 103  101 - 111 mmol/L Final  . CO2 09/05/2017 23  22 - 32 mmol/L Final  . Glucose, Bld 09/05/2017 155* 65 - 99 mg/dL Final  . BUN 09/05/2017 15  6 - 20 mg/dL Final  . Creatinine, Ser 09/05/2017 1.10* 0.44 - 1.00 mg/dL Final  . Calcium 09/05/2017 7.7* 8.9 - 10.3 mg/dL Final  . GFR calc non Af Amer 09/05/2017 51* >60 mL/min Final  . GFR calc Af Amer 09/05/2017 59* >60 mL/min Final   Comment: (NOTE) The eGFR has been calculated using the CKD EPI equation. This calculation has not been validated in all clinical situations. eGFR's persistently <60 mL/min signify possible Chronic Kidney Disease.   Georgiann Hahn gap 09/05/2017 9  5 - 15 Final   Performed at [facility], [address] [place], [place] [address]  . WBC 09/05/2017 28.1* 3.6 - 11.0 K/uL Final  . RBC 09/05/2017 3.89  3.80 - 5.20 MIL/uL Final  . Hemoglobin 09/05/2017 10.9* 12.0 - 16.0 g/dL Final  . HCT 09/05/2017 33.7* 35.0 - 47.0 % Final  . MCV 09/05/2017 86.9  80.0 - 100.0 fL Final  . MCH 09/05/2017 28.0  26.0 - 34.0 pg Final  . MCHC 09/05/2017 32.3  32.0 - 36.0 g/dL Final  . RDW 09/05/2017 14.5  11.5 - 14.5 % Final  . Platelets 09/05/2017 392  150 - 440 K/uL Final   Performed at [facility], [address], [place], [place] [address]  . Lactic Acid, Venous 09/05/2017 2.6* 0.5 - 1.9 mmol/L Final   Comment: CRITICAL RESULT CALLED TO, READ BACK BY AND VERIFIED WITH BRITTANY [facility] 09/05/17 2956 SJL Performed at [facility], [address] [place],  [address]   . Glucose-Capillary 09/05/2017 176* 65 - 99 mg/dL Final  . Glucose-Capillary 09/06/2017 101* 65 - 99 mg/dL Final  . WBC 09/06/2017 22.8* 3.6 - 11.0 K/uL Final  . RBC 09/06/2017 3.26* 3.80 - 5.20 MIL/uL Final  . Hemoglobin 09/06/2017 9.3*  12.0 - 16.0 g/dL Final  . HCT 09/06/2017 28.6* 35.0 - 47.0 % Final  . MCV 09/06/2017 87.7  80.0 - 100.0 fL Final  . MCH 09/06/2017 28.5  26.0 - 34.0 pg Final  . MCHC 09/06/2017 32.5  32.0 - 36.0 g/dL Final  . RDW 09/06/2017 14.6* 11.5 - 14.5 % Final  . Platelets 09/06/2017 373  150 - 440 K/uL Final   Performed at [facility], [address], [place],  [address]  . Sodium 09/06/2017 140  135 - 145 mmol/L Final  . Potassium 09/06/2017 3.5  3.5 - 5.1 mmol/L Final  . Chloride 09/06/2017 111  101 - 111 mmol/L Final  . CO2 09/06/2017 22  22 - 32 mmol/L Final  . Glucose, Bld 09/06/2017 90  65 - 99 mg/dL Final  . BUN 09/06/2017 20  6 - 20 mg/dL Final  . Creatinine, Ser 09/06/2017 0.85  0.44 - 1.00 mg/dL Final  . Calcium 09/06/2017 7.8* 8.9 - 10.3 mg/dL Final  . GFR calc non Af Amer 09/06/2017 >60  >60 mL/min  Final  . GFR calc Af Amer 09/06/2017 >60  >60 mL/min Final   Comment: (NOTE) The eGFR has been calculated using the CKD EPI equation. This calculation has not been validated in all clinical situations. eGFR's persistently <60 mL/min signify possible Chronic Kidney Disease.   Georgiann Hahn gap 09/06/2017 7  5 - 15 Final   Performed at [facility], [address] [place], [place] [address]  . Specimen Description 09/06/2017 EXPECTORATED SPUTUM   Final  . Special Requests 09/06/2017 Normal   Final  . Sputum evaluation 09/06/2017    Final                   Value:THIS SPECIMEN IS ACCEPTABLE FOR SPUTUM CULTURE Performed at [facility], [address] [place], [place] [address]   . Report Status 09/06/2017 09/06/2017 FINAL   Final  . Specimen Description 09/06/2017    Final                   Value:EXPECTORATED SPUTUM Performed at [facility], [address] [place], [place] [address]   . Special Requests 09/06/2017    Final                   Value:Normal Reflexed from [id number] Performed at [facility], [address]  [place], [place] [address]   . Gram Stain 09/06/2017    Final                   Value:RARE WBC PRESENT, PREDOMINANTLY PMN RARE GRAM POSITIVE COCCI RARE GRAM POSITIVE RODS   . Culture 09/06/2017    Final                   Value:Consistent with normal respiratory flora. Performed at [facility], [place] [address] [place], [place] [address]   . Report Status 09/06/2017 09/09/2017 FINAL   Final  . Glucose-Capillary 09/07/2017 89  65 - 99 mg/dL Final  . WBC 09/08/2017 11.9* 3.6 - 11.0 K/uL Final  . RBC 09/08/2017 3.68* 3.80 - 5.20 MIL/uL Final  . Hemoglobin 09/08/2017 10.4* 12.0 - 16.0 g/dL Final  . HCT 09/08/2017 32.0* 35.0 - 47.0 % Final  . MCV 09/08/2017 87.0  80.0 - 100.0 fL Final  . MCH 09/08/2017 28.3  26.0 - 34.0 pg Final  . MCHC 09/08/2017 32.5  32.0 - 36.0 g/dL Final  . RDW 09/08/2017 14.5  11.5 - 14.5 % Final  . Platelets 09/08/2017 421  150 - 440 K/uL Final   Performed at [facility], [address], [place],  [address]  . Sodium 09/08/2017 141  135 - 145 mmol/L Final  . Potassium 09/08/2017 3.3* 3.5 - 5.1 mmol/L Final  . Chloride 09/08/2017 107  101 - 111 mmol/L Final  . CO2 09/08/2017 26  22 - 32 mmol/L Final  . Glucose, Bld 09/08/2017 107* 65 - 99 mg/dL Final  . BUN 09/08/2017 15  6 - 20 mg/dL Final  . Creatinine, Ser 09/08/2017 0.85  0.44 - 1.00 mg/dL Final  . Calcium 09/08/2017 8.0* 8.9 - 10.3 mg/dL Final  . GFR calc non Af Amer 09/08/2017 >60  >60 mL/min Final  . GFR calc Af Amer 09/08/2017 >60  >60 mL/min Final   Comment: (NOTE) The eGFR has been calculated using the CKD EPI equation. This calculation has not been validated in all clinical situations. eGFR's persistently <60 mL/min signify possible Chronic Kidney Disease.   Georgiann Hahn  gap 09/08/2017 8  5 - 15 Final   Performed at [facility], [address] [place], [place] [address]  . Glucose-Capillary 09/08/2017 85  65 - 99 mg/dL Final  . Glucose-Capillary 09/09/2017 108* 65 - 99  mg/dL Final  . Glucose-Capillary 09/09/2017 90  65 - 99 mg/dL Final  . SURGICAL PATHOLOGY 09/09/2017    Final-Edited                   Value:Surgical Pathology **** THIS IS AN ADDENDUM REPORT **** CASE: [id number] PATIENT: [facility] Surgical Pathology Report **********Addendum **********  Reason for Addendum #1:  Immunohistochemistry results Reason for Addendum #2:  Additional clinical/test information  SPECIMEN SUBMITTED: A. Cervix; bx  CLINICAL HISTORY: Large cervical mass  PRE-OPERATIVE DIAGNOSIS: Cervical cancer  POST-OPERATIVE DIAGNOSIS: None provided.     DIAGNOSIS: A. CERVIX; BIOPSY: - INVASIVE SQUAMOUS CELL CARCINOMA.  Comment: Non-keratinizing squamous cell carcinoma is present, with stromal invasion. Focal areas are highly suspicious for lymphovascular invasion. There is extensive necrosis. One fragment appears to be surfaced by high-grade squamous intraepithelial lesion (CIN 3).  Immunohistochemistry (IHC) for DNA mismatch repair (MMR) has been requested and the result will be reported in an addendum. PD-L1 IHC will also be performed, and a second addendum will be issued w                         ith the result.  The diagnosis was called to Dr. Tonette Bihari nurse Levada Dy on 09/10/2017 at 11:17 AM. Read-back was performed. The case was also discussed with Dr. Mike Gip and she is in agreement with the request for MMR and PD-L1 assessment.   GROSS DESCRIPTION: A. Labeled: cervical biopsy Tissue fragment(s): multiple Size: aggregate, 2.6 x 1.2 x 0.1 cm Description: in formalin, blood clot and pink fragments of tissue  Entirely submitted in 1 cassette(s).       Final Diagnosis performed by Bryan Lemma, MD.  Electronically signed 09/10/2017 1:47:02PM   The electronic signature indicates that the named Attending Pathologist has evaluated the specimen  Technical component performed at [facility], [address] [place], [place] [address] Lab:  [phone] Dir: Rush Farmer, MD, MMM  Professional component performed at [facility], [address], [place], [place] [address] Lab: [phone] Dir: Dellia Nims. Reuel Derby, MD   ADDENDU                         M #1: Immunohistochemistry (IHC) Testing for DNA Mismatch Repair (MMR) Proteins: Results: MLH1: Intact nuclear expression MSH2: Intact nuclear expression MSH6: Intact nuclear expression PMS2: Intact nuclear expression  IHC Interpretation: No loss of nuclear expression of MMR proteins: Low probability of MSI-H.  Testing was performed on block: A1  IHC slides were prepared by [facility], [facility], [place], and interpreted by Dr. Dicie Beam. All controls stained appropriately.  Addendum #1 performed by Bryan Lemma, MD.  Electronically signed 09/13/2017 12:19:57PM   Technical component performed at [place], [address] [place], [place] [address] Lab: [phone] Dir: Rush Farmer, MD, MMM  Professional component performed at [facility], [facility], [place], [place], [place] [address] Lab: [phone] Dir: Dellia Nims. Reuel Derby, MD   ADDENDUM #2:  PD-L1 IMMUNOHISTOCHEMISTRY ANALYSIS, PEMBROLIZUMAB (KEYTRUDA)  Result: Combined Positive Score: 6 Interpr                         etation: Expressed  Reference Range for Cervical  Cancer: CPS = Combined Positive Score = 100 x (PD-L1 reactive tumor cells + tumor-associated lymphocytes + macrophages) / (total number of viable tumor cells) CPS < 1 = No Expression CPS ? 1 = Expression  Methodology: PD-L1 69V9 pharmDx(TM) is intended for use in the detection of PD-L1 in formalin-fixed paraffin-embedded cervical cancer tissue using the EnVision FLEX visualization system on Aut[employer] Link 48. The assay is indicated as an aid in identifying cervical cancer patients for treatment with KEYTRUDA(R) (pembrolizumab).  Testing performed on block: A1 Testing was performed by [place] [facility] [employer], [place], [place]. Integrated Oncology reference #: [id number]  Addendum #2 performed by Bryan Lemma, MD.  Electronically signed 09/17/2017 3:28:39PM    Technical component performed at [place], [address] [place], [place] [address] Lab: [phone] Dir: Rush Farmer, MD, M                         MM  Professional component performed at [facility], [address], [place], [place] [address] Lab: [phone] Dir: Dellia Nims. Rubinas, MD    . Glucose-Capillary 09/10/2017 104* 65 - 99 mg/dL Final  . Glucose-Capillary 09/11/2017 87  65 - 99 mg/dL Final  . Glucose-Capillary 09/12/2017 82  65 - 99 mg/dL Final  . Glucose-Capillary 09/13/2017 85  65 - 99 mg/dL Final  . Comment 1 09/13/2017 Notify RN   Final  . Comment 2 09/13/2017 Document in Chart   Final  . Glucose-Capillary 09/14/2017 107* 65 - 99 mg/dL Final  . Glucose-Capillary 09/15/2017 160* 65 - 99 mg/dL Final    Assessment:  Stacy Zhang is a 67 y.o. female withmetastatic cervical cancer s/p cervical biopsy on 09/09/2017.  Pathology confirmed invasive squamous cell cancer.  She presented with a fatigue, shortness of breath, a 25 pound weight loss, and intermittent vaginal bleeding for 2 1/2 years.  Chest, abdomen, and pelvic CT on 09/08/2017 revealed a large macrolobulated irregular heterogeneous enhancing soft tissue masscentered at the cervix/lower uterine segment measuring 7.6 x 6.2 x 5.4 cm in size most favoring a cervical neoplasm though endometrial neoplasm and vaginal neoplasm werenot excluded. There was direct extension of tumor to involve the vagina and posterior bladder with obstruction of the the LEFT ureter and marked LEFT hydroureteronephrosis and impaired LEFT renal function, as well as cervical obstruction with hydrometrocolpos.There were multiple hepatic metastases.There was extensive osseous metastatic lesioninvolving the anterior  column and medial wall of the RIGHT acetabulum as well as the RIGHT pubic body and RIGHT superior/inferior pubic rami. There was pelvic adenopathy. There were BILATERAL pleural effusions with compressive atelectasis and question minimal infiltrate in lower lobes.  Symptomatically, she is fatigued. Exam reveals cachexia, decreased breath sounds at the bases, and suprapubic discomfort.  Plan: 1.   2.  Follow-up mismatch repair/microsatellite instability and PDL-1 on tumor. 3.   4.    Lequita Asal, MD  09/18/2017, 5:12 AM

## 2017-09-21 ENCOUNTER — Ambulatory Visit
Admission: RE | Admit: 2017-09-21 | Discharge: 2017-09-21 | Disposition: A | Discharge status: Home / Self Care | Payer: Medicare Other | Source: Ambulatory Visit | Attending: Radiation Oncology | Admitting: Radiation Oncology

## 2017-09-22 ENCOUNTER — Ambulatory Visit: Payer: Medicare Other

## 2017-09-22 ENCOUNTER — Emergency Department: Payer: Medicare Other

## 2017-09-22 ENCOUNTER — Encounter: Payer: Self-pay | Admitting: Emergency Medicine

## 2017-09-22 ENCOUNTER — Inpatient Hospital Stay
Admission: EM | Admit: 2017-09-22 | Discharge: 2017-10-02 | DRG: 871 | Disposition: E | Payer: Medicare Other | Attending: Internal Medicine | Admitting: Internal Medicine

## 2017-09-22 ENCOUNTER — Other Ambulatory Visit: Payer: Self-pay

## 2017-09-22 DIAGNOSIS — E43 Unspecified severe protein-calorie malnutrition: Secondary | ICD-10-CM | POA: Diagnosis not present

## 2017-09-22 DIAGNOSIS — D649 Anemia, unspecified: Secondary | ICD-10-CM | POA: Diagnosis not present

## 2017-09-22 DIAGNOSIS — C799 Secondary malignant neoplasm of unspecified site: Secondary | ICD-10-CM | POA: Insufficient documentation

## 2017-09-22 DIAGNOSIS — N136 Pyonephrosis: Secondary | ICD-10-CM | POA: Diagnosis not present

## 2017-09-22 DIAGNOSIS — Z7952 Long term (current) use of systemic steroids: Secondary | ICD-10-CM

## 2017-09-22 DIAGNOSIS — Z7189 Other specified counseling: Secondary | ICD-10-CM

## 2017-09-22 DIAGNOSIS — A419 Sepsis, unspecified organism: Secondary | ICD-10-CM | POA: Diagnosis not present

## 2017-09-22 DIAGNOSIS — Z7951 Long term (current) use of inhaled steroids: Secondary | ICD-10-CM

## 2017-09-22 DIAGNOSIS — R319 Hematuria, unspecified: Secondary | ICD-10-CM | POA: Insufficient documentation

## 2017-09-22 DIAGNOSIS — C539 Malignant neoplasm of cervix uteri, unspecified: Secondary | ICD-10-CM | POA: Diagnosis present

## 2017-09-22 DIAGNOSIS — Z515 Encounter for palliative care: Secondary | ICD-10-CM

## 2017-09-22 DIAGNOSIS — N19 Unspecified kidney failure: Secondary | ICD-10-CM

## 2017-09-22 DIAGNOSIS — R310 Gross hematuria: Secondary | ICD-10-CM | POA: Diagnosis not present

## 2017-09-22 DIAGNOSIS — R339 Retention of urine, unspecified: Secondary | ICD-10-CM | POA: Diagnosis present

## 2017-09-22 DIAGNOSIS — C787 Secondary malignant neoplasm of liver and intrahepatic bile duct: Secondary | ICD-10-CM | POA: Diagnosis not present

## 2017-09-22 DIAGNOSIS — Z87891 Personal history of nicotine dependence: Secondary | ICD-10-CM

## 2017-09-22 DIAGNOSIS — E875 Hyperkalemia: Secondary | ICD-10-CM | POA: Diagnosis not present

## 2017-09-22 DIAGNOSIS — N179 Acute kidney failure, unspecified: Secondary | ICD-10-CM | POA: Diagnosis present

## 2017-09-22 DIAGNOSIS — C775 Secondary and unspecified malignant neoplasm of intrapelvic lymph nodes: Secondary | ICD-10-CM | POA: Diagnosis not present

## 2017-09-22 DIAGNOSIS — C7951 Secondary malignant neoplasm of bone: Secondary | ICD-10-CM | POA: Diagnosis present

## 2017-09-22 DIAGNOSIS — Z66 Do not resuscitate: Secondary | ICD-10-CM | POA: Diagnosis not present

## 2017-09-22 DIAGNOSIS — Z681 Body mass index (BMI) 19 or less, adult: Secondary | ICD-10-CM | POA: Diagnosis not present

## 2017-09-22 DIAGNOSIS — R64 Cachexia: Secondary | ICD-10-CM | POA: Diagnosis present

## 2017-09-22 HISTORY — DX: Malignant (primary) neoplasm, unspecified: C80.1

## 2017-09-22 LAB — URINALYSIS, COMPLETE (UACMP) WITH MICROSCOPIC
Bacteria, UA: NONE SEEN
SPECIFIC GRAVITY, URINE: 1.025 (ref 1.005–1.030)
Squamous Epithelial / LPF: NONE SEEN

## 2017-09-22 LAB — CBC WITH DIFFERENTIAL/PLATELET
Basophils Absolute: 0.1 10*3/uL (ref 0–0.1)
Basophils Relative: 1 %
EOS ABS: 0 10*3/uL (ref 0–0.7)
Eosinophils Relative: 0 %
HEMATOCRIT: 25.2 % — AB (ref 35.0–47.0)
Hemoglobin: 8.1 g/dL — ABNORMAL LOW (ref 12.0–16.0)
LYMPHS ABS: 2.2 10*3/uL (ref 1.0–3.6)
LYMPHS PCT: 9 %
MCH: 27.8 pg (ref 26.0–34.0)
MCHC: 32 g/dL (ref 32.0–36.0)
MCV: 86.9 fL (ref 80.0–100.0)
MONOS PCT: 7 %
Monocytes Absolute: 1.8 10*3/uL — ABNORMAL HIGH (ref 0.2–0.9)
Neutro Abs: 21.6 10*3/uL — ABNORMAL HIGH (ref 1.4–6.5)
Neutrophils Relative %: 83 %
Platelets: 398 10*3/uL (ref 150–440)
RBC: 2.9 MIL/uL — AB (ref 3.80–5.20)
RDW: 16.8 % — ABNORMAL HIGH (ref 11.5–14.5)
WBC: 25.7 10*3/uL — ABNORMAL HIGH (ref 3.6–11.0)

## 2017-09-22 LAB — COMPREHENSIVE METABOLIC PANEL
ALT: 106 U/L — AB (ref 14–54)
AST: 225 U/L — AB (ref 15–41)
Albumin: 2.9 g/dL — ABNORMAL LOW (ref 3.5–5.0)
Alkaline Phosphatase: 137 U/L — ABNORMAL HIGH (ref 38–126)
Anion gap: 11 (ref 5–15)
BILIRUBIN TOTAL: 0.5 mg/dL (ref 0.3–1.2)
BUN: 64 mg/dL — AB (ref 6–20)
CO2: 24 mmol/L (ref 22–32)
CREATININE: 2.03 mg/dL — AB (ref 0.44–1.00)
Calcium: 8.8 mg/dL — ABNORMAL LOW (ref 8.9–10.3)
Chloride: 109 mmol/L (ref 101–111)
GFR calc Af Amer: 28 mL/min — ABNORMAL LOW (ref >60)
GFR, EST NON AFRICAN AMERICAN: 24 mL/min — AB (ref >60)
Glucose, Bld: 86 mg/dL (ref 65–99)
Potassium: 5.8 mmol/L — ABNORMAL HIGH (ref 3.5–5.1)
Sodium: 144 mmol/L (ref 135–145)
TOTAL PROTEIN: 6.8 g/dL (ref 6.5–8.1)

## 2017-09-22 LAB — PROTIME-INR
INR: 1.26
Prothrombin Time: 15.7 seconds — ABNORMAL HIGH (ref 11.4–15.2)

## 2017-09-22 LAB — LACTIC ACID, PLASMA
LACTIC ACID, VENOUS: 2.7 mmol/L — AB (ref 0.5–1.9)
Lactic Acid, Venous: 2.3 mmol/L (ref 0.5–1.9)

## 2017-09-22 LAB — MRSA PCR SCREENING: MRSA by PCR: POSITIVE — AB

## 2017-09-22 MED ORDER — DOCUSATE SODIUM 100 MG PO CAPS
100.0000 mg | ORAL_CAPSULE | Freq: Two times a day (BID) | ORAL | Status: DC
  Administered 2017-09-22 (×2): 100 mg via ORAL
  Filled 2017-09-22: qty 1

## 2017-09-22 MED ORDER — CHLORHEXIDINE GLUCONATE CLOTH 2 % EX PADS
6.0000 | MEDICATED_PAD | Freq: Every day | CUTANEOUS | Status: DC
  Administered 2017-09-23 – 2017-09-24 (×2): 6 via TOPICAL

## 2017-09-22 MED ORDER — SODIUM CHLORIDE 0.9 % IV SOLN
Freq: Once | INTRAVENOUS | Status: AC
  Administered 2017-09-22: 11:00:00 via INTRAVENOUS

## 2017-09-22 MED ORDER — HEPARIN SODIUM (PORCINE) 5000 UNIT/ML IJ SOLN
5000.0000 [IU] | Freq: Two times a day (BID) | INTRAMUSCULAR | Status: DC
  Administered 2017-09-22: 20:00:00 5000 [IU] via SUBCUTANEOUS
  Filled 2017-09-22: qty 1

## 2017-09-22 MED ORDER — MUPIROCIN 2 % EX OINT
1.0000 "application " | TOPICAL_OINTMENT | Freq: Two times a day (BID) | CUTANEOUS | Status: DC
  Administered 2017-09-22: 1 via NASAL
  Filled 2017-09-22: qty 22

## 2017-09-22 MED ORDER — TRAZODONE HCL 50 MG PO TABS: 25.0000 mg | ORAL_TABLET | Freq: Every evening | ORAL | Status: DC | PRN

## 2017-09-22 MED ORDER — ACETAMINOPHEN 650 MG RE SUPP: 650.0000 mg | Freq: Four times a day (QID) | RECTAL | Status: DC | PRN

## 2017-09-22 MED ORDER — MORPHINE SULFATE (PF) 2 MG/ML IV SOLN: 2.0000 mg | INTRAVENOUS | Status: DC | PRN

## 2017-09-22 MED ORDER — PIPERACILLIN-TAZOBACTAM 3.375 G IVPB 30 MIN
3.3750 g | Freq: Once | INTRAVENOUS | Status: AC
  Administered 2017-09-22: 3.375 g via INTRAVENOUS
  Filled 2017-09-22: qty 50

## 2017-09-22 MED ORDER — MORPHINE SULFATE (PF) 4 MG/ML IV SOLN
4.0000 mg | Freq: Once | INTRAVENOUS | Status: AC
  Administered 2017-09-22: 4 mg via INTRAVENOUS
  Filled 2017-09-22: qty 1

## 2017-09-22 MED ORDER — SODIUM CHLORIDE 0.9 % IV SOLN
INTRAVENOUS | Status: AC
  Filled 2017-09-22: qty 10

## 2017-09-22 MED ORDER — SODIUM CHLORIDE 0.9 % IV SOLN
1.0000 g | Freq: Once | INTRAVENOUS | Status: AC
  Administered 2017-09-22: 1 g via INTRAVENOUS
  Filled 2017-09-22: qty 10

## 2017-09-22 MED ORDER — MEGESTROL ACETATE 400 MG/10ML PO SUSP
400.0000 mg | Freq: Every day | ORAL | Status: DC
  Filled 2017-09-22 (×2): qty 10

## 2017-09-22 MED ORDER — ENSURE ENLIVE PO LIQD
237.0000 mL | Freq: Three times a day (TID) | ORAL | Status: DC
  Administered 2017-09-22: 20:00:00 237 mL via ORAL

## 2017-09-22 MED ORDER — ACETAMINOPHEN 325 MG PO TABS: 650.0000 mg | ORAL_TABLET | Freq: Four times a day (QID) | ORAL | Status: DC | PRN

## 2017-09-22 MED ORDER — VANCOMYCIN HCL IN DEXTROSE 1-5 GM/200ML-% IV SOLN
1000.0000 mg | Freq: Once | INTRAVENOUS | Status: AC
  Administered 2017-09-22: 1000 mg via INTRAVENOUS
  Filled 2017-09-22: qty 200

## 2017-09-22 MED ORDER — ENOXAPARIN SODIUM 40 MG/0.4ML ~~LOC~~ SOLN: 40.0000 mg | SUBCUTANEOUS | Status: DC

## 2017-09-22 MED ORDER — IPRATROPIUM-ALBUTEROL 0.5-2.5 (3) MG/3ML IN SOLN
3.0000 mL | Freq: Two times a day (BID) | RESPIRATORY_TRACT | Status: DC
  Administered 2017-09-22 – 2017-09-23 (×2): 3 mL via RESPIRATORY_TRACT
  Filled 2017-09-22 (×2): qty 3

## 2017-09-22 MED ORDER — BISACODYL 5 MG PO TBEC
5.0000 mg | DELAYED_RELEASE_TABLET | Freq: Every day | ORAL | Status: DC | PRN
Start: 2017-09-22 — End: 2017-09-23

## 2017-09-22 MED ORDER — ONDANSETRON HCL 4 MG/2ML IJ SOLN: 4.0000 mg | Freq: Four times a day (QID) | INTRAMUSCULAR | Status: DC | PRN

## 2017-09-22 MED ORDER — MOMETASONE FURO-FORMOTEROL FUM 200-5 MCG/ACT IN AERO
2.0000 | INHALATION_SPRAY | Freq: Two times a day (BID) | RESPIRATORY_TRACT | Status: DC
  Administered 2017-09-22: 2 via RESPIRATORY_TRACT
  Filled 2017-09-22: qty 8.8

## 2017-09-22 MED ORDER — ONDANSETRON HCL 4 MG PO TABS: 4.0000 mg | ORAL_TABLET | Freq: Four times a day (QID) | ORAL | Status: DC | PRN

## 2017-09-22 MED ORDER — LORAZEPAM 2 MG/ML IJ SOLN
2.0000 mg | INTRAMUSCULAR | Status: DC
  Administered 2017-09-22 – 2017-09-25 (×14): 2 mg via INTRAVENOUS
  Filled 2017-09-22 (×14): qty 1

## 2017-09-22 NOTE — Plan of Care (Signed)
Pt admitted from the ED. VSS. Confused at times. Denies pain. Foley intact. Bloody urine, Dr Vianne Bulls is aware.

## 2017-09-22 NOTE — ED Notes (Signed)
This RN notified Malka RN that pt is in transit and that pt is also confidential at this time. As this has changed since report was given.

## 2017-09-22 NOTE — ED Provider Notes (Addendum)
[facility] Emergency Department Provider Note       Time seen: ----------------------------------------- 9:24 AM on 09/27/2017 -----------------------------------------   I have reviewed the triage vital signs and the nursing notes.  HISTORY   Chief Complaint No chief complaint on file.    HPI Stacy Zhang is a 67 y.o. female with a history of COPD, cervical cancer and urinary retention who presents to the ED for possible urinary tract infection according to EMS.  She is a resident of [facility].  She reportedly has had trouble urinating and bladder pain for the past 2 days.  She denies fevers, chills or other complaints, there was reported possible hematuria for the past 2 days as well.  Reportedly she has metastatic cervical cancer.  Past Medical History:  Diagnosis Date  . COPD (chronic obstructive pulmonary disease) [facility])     Patient Active Problem List   Diagnosis Date Noted  . Cervical cancer, FIGO stage IVB ([place]) 09/18/2017  . Protein-calorie malnutrition, severe 09/08/2017  . Gross hematuria   . Urinary retention   . Sepsis ([place]) 09/05/2017    No past surgical history on file.  Allergies Patient has no known allergies.  Social History Social History   Tobacco Use  . Smoking status: Former Research scientist (life sciences)  . Smokeless tobacco: Never Used  Substance Use Topics  . Alcohol use: No  . Drug use: Not on file    Review of Systems Constitutional: Negative for fever. Cardiovascular: Negative for chest pain. Respiratory: Negative for shortness of breath. Gastrointestinal: Negative for abdominal pain, vomiting and diarrhea. Genitourinary: Positive for dysuria and urinary hesitancy Musculoskeletal: Negative for back pain. Skin: Negative for rash. Neurological: Negative for headaches, focal weakness or numbness.  All systems negative/normal/unremarkable except as stated in the  HPI  ____________________________________________   PHYSICAL EXAM:  VITAL SIGNS: ED Triage Vitals  Enc Vitals Group     BP      Pulse      Resp      Temp      Temp src      SpO2      Weight      Height      Head Circumference      Peak Flow      Pain Score      Pain Loc      Pain Edu?      Excl. in [place]?     Constitutional: Alert and oriented.  Cachectic, chronically ill-appearing, no distress on home O2 Eyes: Conjunctivae are normal. Normal extraocular movements. ENT   Head: Normocephalic and atraumatic.   Nose: No congestion/rhinnorhea.   Mouth/Throat: Mucous membranes are moist.   Neck: No stridor. Cardiovascular: Normal rate, regular rhythm. No murmurs, rubs, or gallops. Respiratory: Prolonged expirations, mild tachypnea, diminished breath sounds Gastrointestinal: Soft, pelvic mass is appreciated Musculoskeletal: Nontender with normal range of motion in extremities. No lower extremity tenderness nor edema. Neurologic:  Normal speech and language. No gross focal neurologic deficits are appreciated.  Skin:  Skin is warm, dry and intact. No rash noted. Psychiatric: Mood and affect are normal. Speech and behavior are normal.  ____________________________________________  ED COURSE:  As part of my medical decision making, I reviewed the following data within the [place] History obtained from family if available, nursing notes, old chart and ekg, as well as notes from prior ED visits. Patient presented for possible urinary tract infection and/or urinary retention, we will assess with labs and imaging as indicated  at this time. Clinical Course as of Sep 22 1221  Tue Sep 22, 2017  1016 Patient does have urinary retention with around 350 cc of urine in her bladder.  Patient is unable to urinate at this time, we will place a Foley catheter.  [JW]    Clinical Course User Index [JW] Earleen Newport, MD    Procedures ____________________________________________   LABS (pertinent positives/negatives)  Labs Reviewed  CBC WITH DIFFERENTIAL/PLATELET - Abnormal; Notable for the following components:      Result Value   WBC 25.7 (*)    RBC 2.90 (*)    Hemoglobin 8.1 (*)    HCT 25.2 (*)    RDW 16.8 (*)    Neutro Abs 21.6 (*)    Monocytes Absolute 1.8 (*)    All other components within normal limits  COMPREHENSIVE METABOLIC PANEL - Abnormal; Notable for the following components:   Potassium 5.8 (*)    BUN 64 (*)    Creatinine, Ser 2.03 (*)    Calcium 8.8 (*)    Albumin 2.9 (*)    AST 225 (*)    ALT 106 (*)    Alkaline Phosphatase 137 (*)    GFR calc non Af Amer 24 (*)    GFR calc Af Amer 28 (*)    All other components within normal limits  LACTIC ACID, PLASMA - Abnormal; Notable for the following components:   Lactic Acid, Venous 2.7 (*)    All other components within normal limits  URINE CULTURE  CULTURE, BLOOD (ROUTINE X 2)  CULTURE, BLOOD (ROUTINE X 2)  URINALYSIS, COMPLETE (UACMP) WITH MICROSCOPIC  LACTIC ACID, PLASMA   CRITICAL CARE Performed by: Earleen Newport   Total critical care time: 30 minutes  Critical care time was exclusive of separately billable procedures and treating other patients.  Critical care was necessary to treat or prevent imminent or life-threatening deterioration.  Critical care was time spent personally by me on the following activities: development of treatment plan with patient and/or surrogate as well as nursing, discussions with consultants, evaluation of patient's response to treatment, examination of patient, obtaining history from patient or surrogate, ordering and performing treatments and interventions, ordering and review of laboratory studies, ordering and review of radiographic studies, pulse oximetry and re-evaluation of patient's condition.  RADIOLOGY Chest x-ray is normal Bladder scan Did reveal around 350 cc of urine CT  renal protocol IMPRESSION: Cervical mass with pelvic nodal metastases, multifocal hepatic metastases, and multifocal osseous metastases, better evaluated on recent enhanced CT.  Secondary severe left hydroureteronephrosis on the basis of the cervical mass. Hyperdense fluid in the left renal collecting system likely reflects hemorrhage versus residual contrast.  Mild right hydroureteronephrosis. Associated 5 mm nonobstructing right upper pole renal calculus.  Layering bladder calculi with residual hemorrhage/hematoma. Consider cystoscopy as clinically warranted given the patient's metastatic malignancy.  Additional ancillary findings as above.  EKG: Interpreted by me, sinus rhythm the rate of 90 bpm, normal PR interval, repolarization abnormality, baseline artifact, normal axis ____________________________________________  DIFFERENTIAL DIAGNOSIS   Urinary retention, UTI, bladder obstruction, renal colic, cervical cancer, renal failure  FINAL ASSESSMENT AND PLAN  Acute urinary retention, UTI, sepsis, hyperkalemia, anemia   Plan: Patient had presented for acute urinary retention and UTI symptoms. Patient's labs revealed some concerning abnormalities including renal failure and marked leukocytosis and acute anemia. Patient's imaging was negative concerning x-ray but did reveal urinary retention.  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  We have given IV fluids as well as broad-spectrum antibiotics for presumed sepsis.  I have discussed with nephrology and urology and she would require bilateral percutaneous nephrostomy tubes which she has declined.  She understands the gravity of her situation and that her condition is terminal.  I will discussed with the hospitalist as well as palliative care.  She may be able to be placed into the hospice home.   Laurence Aly, MD   Note: This note  was generated in part or whole with voice recognition software. Voice recognition is usually quite accurate but there are transcription errors that can and very often do occur. I apologize for any typographical errors that were not detected and corrected.     Earleen Newport, MD 09/29/2017 1225    Earleen Newport, MD 09/15/2017 1319    Earleen Newport, MD 09/13/2017 1323

## 2017-09-22 NOTE — ED Triage Notes (Signed)
Pt arrives via [facility] from [facility] with complains of dysuria and pain in bladder x2 days. Per chart, pt has hx of blood in urine intermittently and has had blood in urine x2 days.  Hx stage IV cervical cancer.

## 2017-09-22 NOTE — ED Notes (Signed)
Paulette RN reported to me at this time that pt has a legal guardian. Estelle Grumbles A.C.DSS.   Phone number contact is    [phone]

## 2017-09-22 NOTE — ED Notes (Signed)
This RN spoke with ACDSS at nurse stations. ACDSS provided the legal guardianship documentation. ACDSS also notified [facility] that pt prob will not be returning at this time.

## 2017-09-22 NOTE — Consult Note (Signed)
                                                                                 Consultation Note Date: 09/29/2017   Patient Name: Stacy Zhang  DOB: 01/04/1951  MRN: [id number]  Age / Sex: 66 y.o., female  PCP: Bender, Abby Daneele, MD Referring Physician: Konidena, Snehalatha, MD  Reason for Consultation: Establishing goals of care  HPI/Patient Profile: 66 y.o. female  with past medical history of stage IV cervical cancer with mets to liver and bone (had plans for palliative radiation/chemotherapy), severe protein malnourishment, COPD, recently discharged from hospital for admission due to septicemia related to pneumonia and COPD exacerbation presented to ED on 09/05/2017 with complaints of abdominal pain and being unable to void urine. Workup revealed bladder obstruction with severe hydrourerternephrosis with hemorrhage in the collecting system. Foley placed with gross hematuria output. Nephrostomy tubes recommended but patient declined. Patient requesting comfort measures only. Of note- patient has been deemed incompetent and has a legal guardian- Stacy Zhang- Social Worker with [place] [employer].  Palliative medicine consulted for goals of care and Hospice evaluation.   Clinical Assessment and Goals of Care: I met at bedside with patient and patient's guardian Stacy Zhang. Patient is in bed, appears severely cachexic, temporal muscle wasting is noted.  Discussed patient's co-morbidities and illness trajectory.  Patient denies pain at this time. States she feels much better since foley placed and bladder drained.  We discussed recommendation for nephrostomy tubes- and that while they will not fix her cancer- they will prolong her life somewhat- she is adamant that she does not want this procedure, she understands this will shorten her life expectancy to days to weeks.  She also has an infection- and is likely septic. We discussed that treating the infection with antibiotics will not  fix the cancer and will not fix the bleeding around her kidney or her bladder obstruction for which she doesn't want the nephrostomy tubes.  We discussed possible transition to full comfort care. Receiving pain medication if she's in pain, anxiety medication if she's anxious. Controlling other symptoms of end of life as needed. She agrees this is her goal of care.  We also discussed location. She tells me she feels "safe" in the hospital. She can't tell me any other details about why she feels safe or why she fears leaving the hospital. Discussed possible residential hospice home with patient and legal guardian.    Primary Decision Maker LEGAL GUARDIAN - Stacy Zhang- social worker- [phone]; [phone]   SUMMARY OF RECOMMENDATIONS -DNR -Patient is not competent decision maker- legal guardian is decision maker- however- patient was able to verbalize her wishes -Stacy Zhang to call once she discusses all options with her supervisor -I recommended full comfort care and residential hospice based on patient's GOC, terminal illness, and very poor prognosis, will minimal options    Code Status/Advance Care Planning:  DNR  Palliative Prophylaxis:   Frequent Pain Assessment   Additional Recommendations (Limitations, Scope, Preferences):  Avoid Hospitalization   Prognosis:    < 2 weeks  Discharge Planning: To Be Determined  Primary Diagnoses: Present on Admission: . Sepsis (HCC)   I   have reviewed the medical record, interviewed the patient and family, and examined the patient. The following aspects are pertinent.  Past Medical History:  Diagnosis Date  . Cancer (HCC)    stage IV cervical  . COPD (chronic obstructive pulmonary disease) (HCC)    Social History   Socioeconomic History  . Marital status: Single    Spouse name: None  . Number of children: None  . Years of education: None  . Highest education level: None  Social Needs  . Financial resource  strain: None  . Food insecurity - worry: None  . Food insecurity - inability: None  . Transportation needs - medical: None  . Transportation needs - non-medical: None  Occupational History  . None  Tobacco Use  . Smoking status: Former Smoker  . Smokeless tobacco: Never Used  Substance and Sexual Activity  . Alcohol use: No  . Drug use: None  . Sexual activity: None  Other Topics Concern  . None  Social History Narrative  . None   No family history on file. Scheduled Meds: . docusate sodium  100 mg Oral BID  . enoxaparin (LOVENOX) injection  40 mg Subcutaneous Q24H  . feeding supplement (ENSURE ENLIVE)  237 mL Oral TID BM  . Ipratropium-Albuterol  2 puff Inhalation BID  . LORazepam  2 mg Intravenous Q4H  . megestrol  400 mg Oral Daily  . mometasone-formoterol  2 puff Inhalation BID  .  morphine injection  4 mg Intravenous Once   Continuous Infusions: PRN Meds:.acetaminophen **OR** acetaminophen, bisacodyl, morphine injection, ondansetron **OR** ondansetron (ZOFRAN) IV, traZODone Medications Prior to Admission:  Prior to Admission medications   Medication Sig Start Date End Date Taking? Authorizing Provider  ALPRAZolam (XANAX) 0.5 MG tablet Take 1 tablet (0.5 mg total) by mouth 2 (two) times daily. 09/13/17  Yes Salary, Montell D, MD  budesonide-formoterol (SYMBICORT) 160-4.5 MCG/ACT inhaler Inhale 2 puffs into the lungs 2 (two) times daily. 09/13/17  Yes Salary, Montell D, MD  COMBIVENT RESPIMAT 20-100 MCG/ACT AERS respimat Inhale 2 puffs into the lungs 2 (two) times daily. 08/10/17  Yes [provider]  diltiazem (CARDIZEM CD) 120 MG 24 hr capsule Take 1 capsule (120 mg total) by mouth daily. 09/13/17  Yes Salary, Montell D, MD  docusate sodium (COLACE) 100 MG capsule Take 1 capsule (100 mg total) by mouth 2 (two) times daily. 09/13/17  Yes Salary, Montell D, MD  feeding supplement, ENSURE ENLIVE, (ENSURE ENLIVE) LIQD Take 237 mLs by mouth 3 (three) times daily between  meals. 09/13/17  Yes Salary, Montell D, MD  fluticasone (FLONASE) 50 MCG/ACT nasal spray Place 1 spray into both nostrils daily. 08/10/17  Yes [provider]  guaiFENesin (MUCINEX) 600 MG 12 hr tablet Take 1 tablet (600 mg total) by mouth 2 (two) times daily. 09/13/17  Yes Salary, Montell D, MD  HYDROcodone-acetaminophen (NORCO/VICODIN) 5-325 MG tablet Take 1-2 tablets by mouth every 4 (four) hours as needed for moderate pain. 09/13/17  Yes Salary, Montell D, MD  megestrol (MEGACE) 400 MG/10ML suspension Take 10 mLs (400 mg total) by mouth daily. 09/14/17  Yes Salary, Montell D, MD  pantoprazole (PROTONIX) 40 MG tablet Take 1 tablet (40 mg total) by mouth daily. 09/14/17  Yes Salary, Montell D, MD  predniSONE (DELTASONE) 50 MG tablet 1 po daily Patient taking differently: Take 50 mg by mouth daily with breakfast.  09/13/17  Yes Salary, Montell D, MD  nystatin (MYCOSTATIN) 100000 UNIT/ML suspension Take 5 mLs (500,000 Units total)   by mouth 4 (four) times daily. 09/13/17   Salary, Avel Peace, MD  valACYclovir (VALTREX) 1000 MG tablet Take 1 tablet (1,000 mg total) by mouth 3 (three) times daily. Patient not taking: Reported on 09/30/2017 05/15/16   Hagler, Jami L, PA-C   No Known Allergies Review of Systems  Constitutional: Positive for appetite change and fatigue.  Gastrointestinal: Positive for abdominal pain.  Psychiatric/Behavioral: The patient is nervous/anxious.     Physical Exam  Constitutional:  cachetic  HENT:  Head: Normocephalic and atraumatic.  Cardiovascular: Normal rate.  Pulmonary/Chest: Effort normal.  Abdominal: There is tenderness.  Neurological: She is alert.  Psychiatric:  Some abnormal thought content at times  Nursing note and vitals reviewed.   Vital Signs: BP (!) 136/59   Pulse (!) 111   Temp 98 F (36.7 C) (Oral)   Resp 18   Ht 5' 2" (1.575 m)   Wt 38.6 kg (85 lb)   SpO2 100%   BMI 15.55 kg/m      Pain Score: 10-Worst pain ever   SpO2: SpO2: 100  % O2 Device:SpO2: 100 % O2 Flow Rate: .O2 Flow Rate (L/min): 2 L/min  IO: Intake/output summary:   Intake/Output Summary (Last 24 hours) at 09/14/2017 1503 Last data filed at 09/12/2017 1105 Gross per 24 hour  Intake -  Output 200 ml  Net -200 ml    LBM:   Baseline Weight: Weight: 38.6 kg (85 lb) Most recent weight: Weight: 38.6 kg (85 lb)     Palliative Assessment/Data: PPS: 20%     Thank you for this consult. Palliative medicine will continue to follow and assist as needed.   Time In: 1415 Time Out: 1530 Time Total: 75 minutes Greater than 50%  of this time was spent counseling and coordinating care related to the above assessment and plan.  Signed by: [name], AGNP-C Palliative Medicine    Please contact Palliative Medicine Team phone at [phone] for questions and concerns.  For individual provider: See Shea Evans

## 2017-09-22 NOTE — Consult Note (Signed)
Urology Consult  I have been asked to see the patient by Dr. Jimmye Norman , for evaluation and management of hydronephrosis, sepsis, gross hematuria.  Chief Complaint: urinary retention  History of Present Illness: Stacy Zhang is a 67 y.o. 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    In the emergency room, her labs are grossly abnormal with a rising creatinine (0.85 --> 2.03), significant leukocytosis (25), elevated lactate, grossly bloody urine upon Foley catheter placement.    She did experience significant relief upon catheter placement.  Repeat noncontrast CT scan now shows progression with mild right hydronephrosis, significant clot burden versus tumor within the bladder which is markedly distended (prior to catheter placement) possible hemorrhage into the left collecting system versus residual contrast material from previous scan in the setting of high-grade chronic obstruction.   Past Medical History:  Diagnosis Date  . Cancer ([place])    stage IV cervical  . COPD (chronic obstructive pulmonary disease) ([place])     History reviewed. No pertinent surgical history.  Home Medications:  Current Meds  Medication Sig  . ALPRAZolam (XANAX) 0.5 MG tablet Take 1 tablet (0.5 mg total) by mouth 2 (two) times daily.  . budesonide-formoterol (SYMBICORT) 160-4.5 MCG/ACT inhaler Inhale 2 puffs into the lungs 2 (two) times daily.  . COMBIVENT RESPIMAT 20-100 MCG/ACT AERS respimat Inhale 2 puffs into the lungs 2 (two) times daily.  Marland Kitchen diltiazem (CARDIZEM CD) 120 MG 24 hr capsule Take 1 capsule (120 mg total) by mouth daily.  Marland Kitchen docusate sodium (COLACE) 100 MG capsule Take 1 capsule (100 mg total) by mouth 2 (two) times daily.  . feeding supplement, ENSURE ENLIVE, (ENSURE ENLIVE) LIQD Take 237 mLs by mouth 3 (three) times  daily between meals.  . fluticasone (FLONASE) 50 MCG/ACT nasal spray Place 1 spray into both nostrils daily.  Marland Kitchen guaiFENesin (MUCINEX) 600 MG 12 hr tablet Take 1 tablet (600 mg total) by mouth 2 (two) times daily.  Marland Kitchen HYDROcodone-acetaminophen (NORCO/VICODIN) 5-325 MG tablet Take 1-2 tablets by mouth every 4 (four) hours as needed for moderate pain.  . megestrol (MEGACE) 400 MG/10ML suspension Take 10 mLs (400 mg total) by mouth daily.  . pantoprazole (PROTONIX) 40 MG tablet Take 1 tablet (40 mg total) by mouth daily.  . predniSONE (DELTASONE) 50 MG tablet 1 po daily (Patient taking differently: Take 50 mg by mouth daily with breakfast. )    Allergies: No Known Allergies  No family history on file.  Social History:  reports that she has quit smoking. she has never used smokeless tobacco. She reports that she does not drink alcohol. Her drug history is not on file.  ROS: A complete 12 point review of systems was performed.  Review is positive for shortness of breath, fatigue, malaise, weight loss, poor appetite, urinary symptoms as above, vaginal bleeding.   Physical Exam:  Vital signs in last 24 hours: Temp:  [98 F (36.7 C)] 98 F (36.7 C) (02/19 0926) Pulse Rate:  [111] 111 (02/19 0930) Resp:  [18] 18 (02/19 0926) BP: (112-136)/(52-59) 136/59 (02/19 1030) SpO2:  [100 %] 100 % (02/19 0930) Weight:  [85 lb (38.6 kg)] 85 lb (38.6 kg) (02/19 0927) Constitutional: Alert, thin, frail, cachectic appearing HEENT: [place] AT, moist mucus membranes.  Trachea midline, no masses Cardiovascular: Pale Respiratory: Mild SOB, wearing 02 GI: Abdomen  is soft, nontender, nondistended, no abdominal masses GU: Foley draining grossly bloody urine Neurologic: Grossly intact, no focal deficits, moving all 4 extremities Psychiatric: Anxious and somewhat tearful  Laboratory Data:  Recent Labs    09/06/2017 1000  WBC 25.7*  HGB 8.1*  HCT 25.2*   Recent Labs    09/26/2017 1000  NA 144  K 5.8*  CL 109    CO2 24  GLUCOSE 86  BUN 64*  CREATININE 2.03*  CALCIUM 8.8*   No results for input(s): LABPT, INR in the last 72 hours. No results for input(s): LABURIN in the last 72 hours. Results for orders placed or performed during the hospital encounter of 09/04/17  Blood culture (routine x 2)     Status: None   Collection Time: 09/04/17 11:54 PM  Result Value Ref Range Status   Specimen Description BLOOD LEFT ANTECUBITAL  Final   Special Requests   Final    BOTTLES DRAWN AEROBIC AND ANAEROBIC Blood Culture adequate volume   Culture   Final    NO GROWTH 5 DAYS Performed at [facility], [address] [place], [place] [address]    Report Status 09/10/2017 FINAL  Final  Blood culture (routine x 2)     Status: None   Collection Time: 09/04/17 11:55 PM  Result Value Ref Range Status   Specimen Description BLOOD BLOOD LEFT ARM  Final   Special Requests   Final    BOTTLES DRAWN AEROBIC AND ANAEROBIC Blood Culture adequate volume   Culture   Final    NO GROWTH 5 DAYS Performed at [facility], [address] [place], [place] [address]    Report Status 09/10/2017 FINAL  Final  Culture, expectorated sputum-assessment     Status: None   Collection Time: 09/06/17  7:52 PM  Result Value Ref Range Status   Specimen Description EXPECTORATED SPUTUM  Final   Special Requests Normal  Final   Sputum evaluation   Final    THIS SPECIMEN IS ACCEPTABLE FOR SPUTUM CULTURE Performed at [facility], [address] [place], [place] [address]    Report Status 09/06/2017 FINAL  Final  Culture, respiratory (NON-Expectorated)     Status: None   Collection Time: 09/06/17  7:52 PM  Result Value Ref Range Status   Specimen Description   Final    EXPECTORATED SPUTUM Performed at [facility], [address] [place], [place] [address]    Special Requests   Final    Normal Reflexed from [id number] Performed at [facility], [address] [place],  [place] [address]    Gram Stain   Final    RARE WBC PRESENT, PREDOMINANTLY PMN RARE GRAM POSITIVE COCCI RARE GRAM POSITIVE RODS    Culture   Final    Consistent with normal respiratory flora. Performed at [facility], [place] [address] [place],  [address]    Report Status 09/09/2017 FINAL  Final     Radiologic Imaging: Dg Chest 1 View  Result Date: 09/08/2017 CLINICAL DATA:  Dyspnea EXAM: CHEST 1 VIEW COMPARISON:  CT chest 09/08/2017 FINDINGS: The heart size and mediastinal contours are within normal limits. Both lungs are clear. The visualized skeletal structures are unremarkable. IMPRESSION: No active disease. Electronically Signed   By: Kathreen Devoid   On: 09/20/2017 10:46   Ct Renal Stone Study  Result Date: 09/05/2017 CLINICAL DATA:  Hematuria, dysuria, stage IV cervical cancer EXAM: CT ABDOMEN AND PELVIS WITHOUT CONTRAST TECHNIQUE: Multidetector  CT imaging of the abdomen and pelvis was performed following the standard protocol without IV contrast. COMPARISON:  Enhanced CT abdomen/pelvis dated 09/08/2017 FINDINGS: Severely motion degraded images. Lower chest: Minimal tree-in-bud nodularity in the medial left lower lobe (series 4/image 21). Hepatobiliary: Diffuse hepatic metastases, better evaluated on prior enhanced study. Gallbladder is grossly unremarkable. No intrahepatic or extrahepatic ductal dilatation. Pancreas: Within normal limits. Spleen: Within normal limits. Adrenals/Urinary Tract: Adrenal glands are within normal limits. 5 mm nonobstructing right upper pole renal calculus (series 2/image 9). Mild right hydroureteronephrosis. Heterogeneous appearance of the left kidney with cortical thinning and possible residual parenchymal enhancement (series 2/image 13). Severe left hydroureteronephrosis. Hyperdense fluid in the left renal collecting system suggests hemorrhage, although technically residual contrast is also possible. The patient's left hydronephrosis is likely secondary to  the known cervical mass. Bladder is notable for layering tiny calculi and residual hemorrhage/hematoma. Stomach/Bowel: Stomach is within normal limits. No evidence of bowel obstruction. Appendix is not discretely visualized. Moderate colonic stool burden, suggesting constipation. Vascular/Lymphatic: No evidence of abdominal aortic aneurysm. Atherosclerotic calcifications of the abdominal aorta and branch vessels. Left greater than right pelvic lymphadenopathy, including a 12 mm short axis deep inguinal node (series 2/image 49) and a 13 mm short axis inguinal node (series 2/image 58). Reproductive: Cervical mass (series 2/image 52), better visualized on recent enhanced CT. Associated distended, fluid-filled endometrial cavity in the uterus (series 2/image 46). No adnexal masses. Other: No abdominopelvic ascites. Musculoskeletal: Multifocal destructive osseous metastases involving the right superior/inferior pubic rami, right acetabulum, and left posterior iliac bone. IMPRESSION: Cervical mass with pelvic nodal metastases, multifocal hepatic metastases, and multifocal osseous metastases, better evaluated on recent enhanced CT. Secondary severe left hydroureteronephrosis on the basis of the cervical mass. Hyperdense fluid in the left renal collecting system likely reflects hemorrhage versus residual contrast. Mild right hydroureteronephrosis. Associated 5 mm nonobstructing right upper pole renal calculus. Layering bladder calculi with residual hemorrhage/hematoma. Consider cystoscopy as clinically warranted given the patient's metastatic malignancy. Additional ancillary findings as above. Electronically Signed   By: Julian Hy M.D.   On: 09/29/2017 12:38   CT scan personally reviewed today  Impression/Plan:  67 year old female with metastatic advanced stage IV cervical cancer with extremely poor prognosis returning to the emergency room today now with significant clot burden/hemorrhage in the bladder, new  right hydronephrosis with worsening renal function, and evidence of sepsis from unknown source.  1. Bilateral hydronephrosis/ AKI- New right hydronephrosis with severe chronic left hydronephrosis.  Suspect new right hydronephrosis either secondary to clot retention or locally advanced disease involving the trigone.  In the setting of sepsis, generally, bilateral percutaneous nephrostomy tubes would be indicated, however, given her extremely poor prognosis, comfort care is a very reasonable option.  This was discussed with Dr. Jimmye Norman today as well as the patient herself. Risks/ benefits discussed.  She understands that this will likely be indefinite.  She is more interested in comfort care and is not interested in pursuing further intervention.  Palliative consult is pending at the time of my visit.  2. Sepsis- Etiology unclear, may be urinary in nature  3.  Gross hematuria- Acute worsening of bleeding likely secondary to underlying disease, not surgical candidate at this time for clot evacuation.    4. Urinary retention- Foley for comfort.  Irrigate as needed to keep catheter draining.    10/01/2017, 1:35 PM  Hollice Espy,  MD

## 2017-09-22 NOTE — ED Notes (Signed)
Patient transported to CT. VSS Pt resting

## 2017-09-22 NOTE — ED Notes (Signed)
Report was called to [facility] Malka RN at this time. Pt is NAD and VSS. Transfer pt at this time.

## 2017-09-22 NOTE — ED Notes (Signed)
ED Sec informed RN that BF keeps calling demanding information. [facility]CD[facility] worker informed this RN that he is not to have known she was there. As per request from [facility] worker pt is now confidential.

## 2017-09-22 NOTE — H&P (Signed)
[facility] [place] NAME: Stacy Zhang    MR#:  [id number]  DATE OF BIRTH:  04/22/1951  DATE OF ADMISSION:  09/27/2017  PRIMARY CARE PHYSICIAN: Letta Median, MD   REQUESTING/REFERRING PHYSICIAN: Dr. Lenise Arena  CHIEF COMPLAINT:   Chief Complaint  Patient presents with  . Urinary Retention    HISTORY OF PRESENT ILLNESS:  Stacy Zhang  is a 67 y.o. female with a known history of recently diagnosed metastatic cervical cancer with metastases to liver and bone comes in with difficulty urinating and found her urine retention and also sepsis, acute renal failure, hyperkalemia.  Patient complained of abdominal pain, unable to void.  Patient discharged from hospital recently after initially he was admitted for multifocal pneumonia and later on found to have metastatic cervical cancer with metastases to liver and bone.  Right now patient states that she does not want any aggressive treatment and wants to be comfortable and wants to be admitted to hospital for that.  Seen by palliative care in the emergency room.  Patient went to [facility] from this hospital stay recently.  PAST MEDICAL HISTORY:   Past Medical History:  Diagnosis Date  . Cancer ([place])    stage IV cervical  . COPD (chronic obstructive pulmonary disease) ([place])     PAST SURGICAL HISTOIRY:  History reviewed. No pertinent surgical history.  SOCIAL HISTORY:   Social History   Tobacco Use  . Smoking status: Former Research scientist (life sciences)  . Smokeless tobacco: Never Used  Substance Use Topics  . Alcohol use: No    FAMILY HISTORY:  No family history on file.  DRUG ALLERGIES:  No Known Allergies  REVIEW OF SYSTEMS:  CONSTITUTIONAL: No fever, fatigue or weakness.  EYES: No blurred or double vision.  EARS, NOSE, AND THROAT: No tinnitus or ear pain.  RESPIRATORY: No cough, shortness of breath, wheezing or hemoptysis.  CARDIOVASCULAR: No chest pain,  orthopnea, edema.  GASTROINTESTINAL: No nausea, vomiting, diarrhea or abdominal pain.  GENITOURINARY: No dysuria, hematuria.  ENDOCRINE: No polyuria, nocturia,  HEMATOLOGY: No anemia, easy bruising or bleeding SKIN: No rash or lesion. MUSCULOSKELETAL: No joint pain or arthritis.   NEUROLOGIC: No tingling, numbness, weakness.  PSYCHIATRY: No anxiety or depression.   MEDICATIONS AT HOME:   Prior to Admission medications   Medication Sig Start Date End Date Taking? Authorizing Provider  ALPRAZolam Duanne Moron) 0.5 MG tablet Take 1 tablet (0.5 mg total) by mouth 2 (two) times daily. 09/13/17  Yes Salary, Avel Peace, MD  budesonide-formoterol (SYMBICORT) 160-4.5 MCG/ACT inhaler Inhale 2 puffs into the lungs 2 (two) times daily. 09/13/17  Yes Salary, Avel Peace, MD  COMBIVENT RESPIMAT 20-100 MCG/ACT AERS respimat Inhale 2 puffs into the lungs 2 (two) times daily. 08/10/17  Yes [provider]  diltiazem (CARDIZEM CD) 120 MG 24 hr capsule Take 1 capsule (120 mg total) by mouth daily. 09/13/17  Yes Salary, Avel Peace, MD  docusate sodium (COLACE) 100 MG capsule Take 1 capsule (100 mg total) by mouth 2 (two) times daily. 09/13/17  Yes Salary, Avel Peace, MD  feeding supplement, ENSURE ENLIVE, (ENSURE ENLIVE) LIQD Take 237 mLs by mouth 3 (three) times daily between meals. 09/13/17  Yes Salary, Avel Peace, MD  fluticasone (FLONASE) 50 MCG/ACT nasal spray Place 1 spray into both nostrils daily. 08/10/17  Yes [provider]  guaiFENesin (MUCINEX) 600 MG 12 hr tablet Take 1 tablet (600 mg total) by mouth 2 (two) times daily.  09/13/17  Yes Salary, Avel Peace, MD  HYDROcodone-acetaminophen (NORCO/VICODIN) 5-325 MG tablet Take 1-2 tablets by mouth every 4 (four) hours as needed for moderate pain. 09/13/17  Yes Salary, Avel Peace, MD  megestrol (MEGACE) 400 MG/10ML suspension Take 10 mLs (400 mg total) by mouth daily. 09/14/17  Yes Salary, Avel Peace, MD  pantoprazole (PROTONIX) 40 MG tablet Take 1 tablet (40 mg  total) by mouth daily. 09/14/17  Yes Salary, Avel Peace, MD  predniSONE (DELTASONE) 50 MG tablet 1 po daily Patient taking differently: Take 50 mg by mouth daily with breakfast.  09/13/17  Yes Salary, Montell D, MD  nystatin (MYCOSTATIN) 100000 UNIT/ML suspension Take 5 mLs (500,000 Units total) by mouth 4 (four) times daily. 09/13/17   Salary, Avel Peace, MD  valACYclovir (VALTREX) 1000 MG tablet Take 1 tablet (1,000 mg total) by mouth 3 (three) times daily. Patient not taking: Reported on 09/09/2017 05/15/16   Hagler, Jami L, PA-C      VITAL SIGNS:  Blood pressure (!) 136/59, pulse (!) 111, temperature 98 F (36.7 C), temperature source Oral, resp. rate 18, height 5\' 2"  (1.575 m), weight 38.6 kg (85 lb), SpO2 100 %.  PHYSICAL EXAMINATION:  GENERAL:  67 y.o.-year-old patient lying in the bed appears severely cachectic  eYES: Pupils equal, round, reactive to light and accommodation. No scleral icteru.  HEENT: Head atraumatic, normocephalic. Oropharynx and nasopharynx clear.  NECK:  Supple, no jugular venous distention. No thyroid enlargement, no tenderness.  LUNGS: expiratory wheeze bilaterally. CARDIOVASCULAR: S1, S2 normal. No murmurs, rubs, or gallops.  ABDOMEN: Soft, nontender, nondistended. Bowel sounds present. No organomegaly or mass.  EXTREMITIES: No pedal edema, cyanosis, or clubbing.  NEUROLOGIC: Cranial nerves II through XII are intact. Muscle strength 5/5 in all extremities. Sensation intact. Gait not checked.  PSYCHIATRIC: The patient is alert and oriented x 3.  SKIN: No obvious rash, lesion, or ulcer.   LABORATORY PANEL:   CBC Recent Labs  Lab 10/01/2017 1000  WBC 25.7*  HGB 8.1*  HCT 25.2*  PLT 398   ------------------------------------------------------------------------------------------------------------------  Chemistries  Recent Labs  Lab 09/12/2017 1000  NA 144  K 5.8*  CL 109  CO2 24  GLUCOSE 86  BUN 64*  CREATININE 2.03*  CALCIUM 8.8*  AST 225*  ALT  106*  ALKPHOS 137*  BILITOT 0.5   ------------------------------------------------------------------------------------------------------------------  Cardiac Enzymes No results for input(s): TROPONINI in the last 168 hours. ------------------------------------------------------------------------------------------------------------------  RADIOLOGY:  Dg Chest 1 View  Result Date: 09/06/2017 CLINICAL DATA:  Dyspnea EXAM: CHEST 1 VIEW COMPARISON:  CT chest 09/08/2017 FINDINGS: The heart size and mediastinal contours are within normal limits. Both lungs are clear. The visualized skeletal structures are unremarkable. IMPRESSION: No active disease. Electronically Signed   By: Kathreen Devoid   On: 09/14/2017 10:46   Ct Renal Stone Study  Result Date: 09/12/2017 CLINICAL DATA:  Hematuria, dysuria, stage IV cervical cancer EXAM: CT ABDOMEN AND PELVIS WITHOUT CONTRAST TECHNIQUE: Multidetector CT imaging of the abdomen and pelvis was performed following the standard protocol without IV contrast. COMPARISON:  Enhanced CT abdomen/pelvis dated 09/08/2017 FINDINGS: Severely motion degraded images. Lower chest: Minimal tree-in-bud nodularity in the medial left lower lobe (series 4/image 21). Hepatobiliary: Diffuse hepatic metastases, better evaluated on prior enhanced study. Gallbladder is grossly unremarkable. No intrahepatic or extrahepatic ductal dilatation. Pancreas: Within normal limits. Spleen: Within normal limits. Adrenals/Urinary Tract: Adrenal glands are within normal limits. 5 mm nonobstructing right upper pole renal calculus (series 2/image 9). Mild right hydroureteronephrosis. Heterogeneous appearance  of the left kidney with cortical thinning and possible residual parenchymal enhancement (series 2/image 13). Severe left hydroureteronephrosis. Hyperdense fluid in the left renal collecting system suggests hemorrhage, although technically residual contrast is also possible. The patient's left hydronephrosis  is likely secondary to the known cervical mass. Bladder is notable for layering tiny calculi and residual hemorrhage/hematoma. Stomach/Bowel: Stomach is within normal limits. No evidence of bowel obstruction. Appendix is not discretely visualized. Moderate colonic stool burden, suggesting constipation. Vascular/Lymphatic: No evidence of abdominal aortic aneurysm. Atherosclerotic calcifications of the abdominal aorta and branch vessels. Left greater than right pelvic lymphadenopathy, including a 12 mm short axis deep inguinal node (series 2/image 49) and a 13 mm short axis inguinal node (series 2/image 58). Reproductive: Cervical mass (series 2/image 52), better visualized on recent enhanced CT. Associated distended, fluid-filled endometrial cavity in the uterus (series 2/image 46). No adnexal masses. Other: No abdominopelvic ascites. Musculoskeletal: Multifocal destructive osseous metastases involving the right superior/inferior pubic rami, right acetabulum, and left posterior iliac bone. IMPRESSION: Cervical mass with pelvic nodal metastases, multifocal hepatic metastases, and multifocal osseous metastases, better evaluated on recent enhanced CT. Secondary severe left hydroureteronephrosis on the basis of the cervical mass. Hyperdense fluid in the left renal collecting system likely reflects hemorrhage versus residual contrast. Mild right hydroureteronephrosis. Associated 5 mm nonobstructing right upper pole renal calculus. Layering bladder calculi with residual hemorrhage/hematoma. Consider cystoscopy as clinically warranted given the patient's metastatic malignancy. Additional ancillary findings as above. Electronically Signed   By: Julian Hy M.D.   On: 09/28/2017 12:38    EKG:   Orders placed or performed during the hospital encounter of 09/28/2017  . ED EKG  . ED EKG    IMPRESSION AND PLAN:   67 year old female with multiple medical problems of metastatic cervical cancer, COPD comes in with  urine retention, found to have acute renal failure due to UTI, and bladder obstruction, patient seen by Dr. Erlene Quan from urology who recommended bilateral nephrostomy tubes but patient does not want any aggressive procedures and wants to be comfortable.  Admitted to once he, continue to morphine, Ativan for comfort care, palliative care is consulted to discuss goals of care, at this time patient does not want any antibiotics or IV fluids.  Focus mainly on the comfort measures. 22`  All the records are reviewed and case discussed with ED provider. Management plans discussed with the patient, family and they are in agreement.  CODE STATUS: DNR  TOTAL TIME TAKING CARE OF THIS PATIENT: 55 minutes.    Epifanio Lesches M.D on 10/01/2017 at 2:57 PM  Between 7am to 6pm - Pager - [phone]  After 6pm go to [email or web address] - password [email or web address] [facility]  Office  [phone]  CC: Primary care physician; Letta Median, MD  Note: This dictation was prepared with Dragon dictation along with smaller phrase technology. Any transcriptional errors that result from this process are unintentional.

## 2017-09-22 NOTE — ED Notes (Signed)
This Rn inserted 14 Fr foley with Amy T RN at bedside. Pt return was dark burgandy blood. Pt is comfortable and VSS WNL.

## 2017-09-22 NOTE — ED Notes (Signed)
Date and time results received: 09/21/2017 1159   Test: lac Critical Value: 2.7  Name of Provider Notified: Jimmye Norman

## 2017-09-23 ENCOUNTER — Ambulatory Visit: Payer: Medicare Other

## 2017-09-23 ENCOUNTER — Inpatient Hospital Stay: Payer: Medicare Other

## 2017-09-23 DIAGNOSIS — A419 Sepsis, unspecified organism: Secondary | ICD-10-CM | POA: Diagnosis not present

## 2017-09-23 DIAGNOSIS — Z515 Encounter for palliative care: Secondary | ICD-10-CM | POA: Diagnosis not present

## 2017-09-23 DIAGNOSIS — R339 Retention of urine, unspecified: Secondary | ICD-10-CM | POA: Diagnosis not present

## 2017-09-23 DIAGNOSIS — R319 Hematuria, unspecified: Secondary | ICD-10-CM | POA: Diagnosis not present

## 2017-09-23 DIAGNOSIS — C799 Secondary malignant neoplasm of unspecified site: Secondary | ICD-10-CM | POA: Diagnosis not present

## 2017-09-23 LAB — BASIC METABOLIC PANEL
Anion gap: 11 (ref 5–15)
BUN: 56 mg/dL — ABNORMAL HIGH (ref 6–20)
CHLORIDE: 110 mmol/L (ref 101–111)
CO2: 23 mmol/L (ref 22–32)
Calcium: 8.6 mg/dL — ABNORMAL LOW (ref 8.9–10.3)
Creatinine, Ser: 1.46 mg/dL — ABNORMAL HIGH (ref 0.44–1.00)
GFR calc Af Amer: 42 mL/min — ABNORMAL LOW (ref >60)
GFR calc non Af Amer: 36 mL/min — ABNORMAL LOW (ref >60)
GLUCOSE: 57 mg/dL — AB (ref 65–99)
POTASSIUM: 5.5 mmol/L — AB (ref 3.5–5.1)
SODIUM: 144 mmol/L (ref 135–145)

## 2017-09-23 LAB — CBC
HEMATOCRIT: 25.7 % — AB (ref 35.0–47.0)
Hemoglobin: 8.3 g/dL — ABNORMAL LOW (ref 12.0–16.0)
MCH: 28.2 pg (ref 26.0–34.0)
MCHC: 32.2 g/dL (ref 32.0–36.0)
MCV: 87.4 fL (ref 80.0–100.0)
Platelets: 378 10*3/uL (ref 150–440)
RBC: 2.94 MIL/uL — ABNORMAL LOW (ref 3.80–5.20)
RDW: 16.7 % — AB (ref 11.5–14.5)
WBC: 21.4 10*3/uL — AB (ref 3.6–11.0)

## 2017-09-23 MED ORDER — MORPHINE BOLUS VIA INFUSION
1.0000 mg | INTRAVENOUS | Status: DC | PRN
  Administered 2017-09-24 (×3): 1 mg via INTRAVENOUS
  Filled 2017-09-23: qty 1

## 2017-09-23 MED ORDER — GLYCOPYRROLATE 0.2 MG/ML IJ SOLN
0.2000 mg | INTRAMUSCULAR | Status: DC | PRN
  Filled 2017-09-23: qty 1

## 2017-09-23 MED ORDER — DIPHENHYDRAMINE HCL 50 MG/ML IJ SOLN
12.5000 mg | INTRAMUSCULAR | Status: DC | PRN
  Filled 2017-09-23: qty 0.25

## 2017-09-23 MED ORDER — GLYCOPYRROLATE 1 MG PO TABS
1.0000 mg | ORAL_TABLET | ORAL | Status: DC | PRN
  Filled 2017-09-23: qty 1

## 2017-09-23 MED ORDER — SODIUM CHLORIDE 0.9 % IV SOLN
1.0000 mg/h | INTRAVENOUS | Status: DC
  Administered 2017-09-23: 18:00:00 1 mg/h via INTRAVENOUS
  Filled 2017-09-23: qty 10

## 2017-09-23 MED ORDER — MORPHINE SULFATE (PF) 2 MG/ML IV SOLN
2.0000 mg | INTRAVENOUS | Status: DC | PRN
  Administered 2017-09-23: 2 mg via INTRAVENOUS
  Filled 2017-09-23: qty 1

## 2017-09-23 NOTE — Progress Notes (Signed)
[place] [facility] [place] NAME: Stacy Zhang    MR#:  [id number]  DATE OF BIRTH:  07/19/1951  SUBJECTIVE: Patient admitted for retention and found to have severe bilateral hydronephrosis, sepsis, acute renal failure.  Recently diagnosed with stage IV cervical cancer.  Patient chose comfort measures on admission did not want any aggressive measures like nephrostomy tube.  Today patient is looking like she is actively dying with cold feet, saying" let me die "to palliative care nurse.  CHIEF COMPLAINT:   Chief Complaint  Patient presents with  . Urinary Retention    REVIEW OF SYSTEMS:   Review of Systems  Unable to perform ROS: Critical illness     DRUG ALLERGIES:  No Known Allergies  VITALS:  Blood pressure 116/61, pulse (!) 112, temperature 98.4 F (36.9 C), temperature source Oral, resp. rate 16, height 5\' 2"  (1.575 m), weight 33.4 kg (73 lb 11.2 oz), SpO2 98 %.  PHYSICAL EXAMINATION:  GENERAL:  67 y.o.-year-old patient lying in the bed . cachectic, unresponsive EYES: Pupils equal reacting to light HEENT: Head atraumatic, normocephalic. Oropharynx and nasopharynx clear.  NECK:  Supple, no jugular venous distention. No thyroid enlargement, no tenderness.  LUNGS:  labored respirations, diminished breath sounds.  CARDIOVASCULAR: S1, S2 normal. No murmurs, rubs, or gallops.  ABDOMEN: Soft, nontender, nondistended. Bowel sounds present. No organomegaly or mass.  EXTREMITIES: No pedal edema, cyanosis, or clubbing.  NEUROLOGIC: Unable to do exam because unresponsive under she is receiving morphine.   SKIN: No obvious rash, lesion, or ulcer.    LABORATORY PANEL:   CBC Recent Labs  Lab 09/23/17 0626  WBC 21.4*  HGB 8.3*  HCT 25.7*  PLT 378   ------------------------------------------------------------------------------------------------------------------  Chemistries  Recent Labs  Lab 10/01/2017 1000 09/23/17 0626  NA 144  144  K 5.8* 5.5*  CL 109 110  CO2 24 23  GLUCOSE 86 57*  BUN 64* 56*  CREATININE 2.03* 1.46*  CALCIUM 8.8* 8.6*  AST 225*  --   ALT 106*  --   ALKPHOS 137*  --   BILITOT 0.5  --    ------------------------------------------------------------------------------------------------------------------  Cardiac Enzymes No results for input(s): TROPONINI in the last 168 hours. ------------------------------------------------------------------------------------------------------------------  RADIOLOGY:  Dg Chest 1 View  Result Date: 09/12/2017 CLINICAL DATA:  Dyspnea EXAM: CHEST 1 VIEW COMPARISON:  CT chest 09/08/2017 FINDINGS: The heart size and mediastinal contours are within normal limits. Both lungs are clear. The visualized skeletal structures are unremarkable. IMPRESSION: No active disease. Electronically Signed   By: Kathreen Devoid   On: 09/20/2017 10:46   Ct Renal Stone Study  Result Date: 09/04/2017 CLINICAL DATA:  Hematuria, dysuria, stage IV cervical cancer EXAM: CT ABDOMEN AND PELVIS WITHOUT CONTRAST TECHNIQUE: Multidetector CT imaging of the abdomen and pelvis was performed following the standard protocol without IV contrast. COMPARISON:  Enhanced CT abdomen/pelvis dated 09/08/2017 FINDINGS: Severely motion degraded images. Lower chest: Minimal tree-in-bud nodularity in the medial left lower lobe (series 4/image 21). Hepatobiliary: Diffuse hepatic metastases, better evaluated on prior enhanced study. Gallbladder is grossly unremarkable. No intrahepatic or extrahepatic ductal dilatation. Pancreas: Within normal limits. Spleen: Within normal limits. Adrenals/Urinary Tract: Adrenal glands are within normal limits. 5 mm nonobstructing right upper pole renal calculus (series 2/image 9). Mild right hydroureteronephrosis. Heterogeneous appearance of the left kidney with cortical thinning and possible residual parenchymal enhancement (series 2/image 13). Severe left hydroureteronephrosis.  Hyperdense fluid in the left renal collecting system suggests hemorrhage, although technically residual  contrast is also possible. The patient's left hydronephrosis is likely secondary to the known cervical mass. Bladder is notable for layering tiny calculi and residual hemorrhage/hematoma. Stomach/Bowel: Stomach is within normal limits. No evidence of bowel obstruction. Appendix is not discretely visualized. Moderate colonic stool burden, suggesting constipation. Vascular/Lymphatic: No evidence of abdominal aortic aneurysm. Atherosclerotic calcifications of the abdominal aorta and branch vessels. Left greater than right pelvic lymphadenopathy, including a 12 mm short axis deep inguinal node (series 2/image 49) and a 13 mm short axis inguinal node (series 2/image 58). Reproductive: Cervical mass (series 2/image 52), better visualized on recent enhanced CT. Associated distended, fluid-filled endometrial cavity in the uterus (series 2/image 46). No adnexal masses. Other: No abdominopelvic ascites. Musculoskeletal: Multifocal destructive osseous metastases involving the right superior/inferior pubic rami, right acetabulum, and left posterior iliac bone. IMPRESSION: Cervical mass with pelvic nodal metastases, multifocal hepatic metastases, and multifocal osseous metastases, better evaluated on recent enhanced CT. Secondary severe left hydroureteronephrosis on the basis of the cervical mass. Hyperdense fluid in the left renal collecting system likely reflects hemorrhage versus residual contrast. Mild right hydroureteronephrosis. Associated 5 mm nonobstructing right upper pole renal calculus. Layering bladder calculi with residual hemorrhage/hematoma. Consider cystoscopy as clinically warranted given the patient's metastatic malignancy. Additional ancillary findings as above. Electronically Signed   By: Julian Hy M.D.   On: 09/13/2017 12:38    EKG:   Orders placed or performed during the hospital encounter of  09/29/2017  . ED EKG  . ED EKG    ASSESSMENT AND PLAN:  67 year old female patient with recently diagnosed stage IV cervical cancer with liver metastases and bone metastases, severe protein malnutrition, COPD who recently discharged from hospital after admission for septicemia COPD came to ER yesterday with abdominal pain, urine retention and found to have severe bilateral hydronephrosis, patient Foley has gross hematuria.  Patient chose comfort measures, looks like she is actively dying so continue morphine, Ativan with comfort measures ,appreciate palliative care seeing the patient.  Patient not stable for hospice home transfer.  Legal guardian Ladona Horns is contacted by palliative care nurse.     All the records are reviewed and case discussed with Care Management/Social Workerr. Management plans discussed with the patient, family and they are in agreement.  CODE STATUS: DNR  TOTAL TIME TAKING CARE OF THIS PATIENT: 35 minutes.  Unstable at this time   Epifanio Lesches M.D on 09/23/2017 at 11:43 AM  Between 7am to 6pm - Pager - [phone]  After 6pm go to [email or web address] - password [email or web address] [facility]  Office  [phone]  CC: Primary care physician; Letta Median, MD   Note: This dictation was prepared with Dragon dictation along with smaller phrase technology. Any transcriptional errors that result from this process are unintentional.

## 2017-09-23 NOTE — Progress Notes (Signed)
Daily Progress Note   Patient Name: Stacy Zhang       Date: 09/23/2017 DOB: Mar 15, 1951  Age: 67 y.o. MRN#: [id number] Attending Physician: Epifanio Lesches, MD Primary Care Physician: Letta Median, MD Admit Date: 09/04/2017  Reason for Consultation/Follow-up: Establishing goals of care  Subjective: Patient in bed. Opens eyes briefly to my voice, denies pain, says- "let me die". Appears to be actively dying. Respirations are rapid and labored. Feet are edematous and warm. Hands are cold.  I called her legal guardian on both numbers to discuss her status- left message requesting return call.  Review of Systems  Unable to perform ROS: Mental status change    Length of Stay: 1  Current Medications: Scheduled Meds:  . Chlorhexidine Gluconate Cloth  6 each Topical Q0600  . docusate sodium  100 mg Oral BID  . feeding supplement (ENSURE ENLIVE)  237 mL Oral TID BM  . heparin injection (subcutaneous)  5,000 Units Subcutaneous Q12H  . ipratropium-albuterol  3 mL Inhalation BID  . LORazepam  2 mg Intravenous Q4H  . megestrol  400 mg Oral Daily  . mometasone-formoterol  2 puff Inhalation BID  . mupirocin ointment  1 application Nasal BID    Continuous Infusions:   PRN Meds: acetaminophen **OR** acetaminophen, bisacodyl, morphine injection, ondansetron **OR** ondansetron (ZOFRAN) IV, traZODone  Physical Exam  Constitutional:  cachectic  Cardiovascular:  tachycardic  Pulmonary/Chest:  labored  Abdominal: Soft. There is tenderness.  Genitourinary:  Genitourinary Comments: Foley in place- draining frank blood  Musculoskeletal: She exhibits edema.  Neurological:  Minimally responsive  Skin: Skin is warm and dry.  Nursing note and vitals reviewed.           Vital  Signs: BP 116/61 (BP Location: Right Arm)   Pulse (!) 112   Temp 98.4 F (36.9 C) (Oral)   Resp 16   Ht 5\' 2"  (1.575 m)   Wt 33.4 kg (73 lb 11.2 oz)   SpO2 98%   BMI 13.48 kg/m  SpO2: SpO2: 98 % O2 Device: O2 Device: Nasal Cannula O2 Flow Rate: O2 Flow Rate (L/min): 2 L/min  Intake/output summary:   Intake/Output Summary (Last 24 hours) at 09/23/2017 1037 Last data filed at 09/23/2017 0454 Gross per 24 hour  Intake -  Output 1100 ml  Net -1100 ml  LBM: Last BM Date: 09/20/17 Baseline Weight: Weight: 38.6 kg (85 lb) Most recent weight: Weight: 33.4 kg (73 lb 11.2 oz)       Palliative Assessment/Data: PPS: 10%    Flowsheet Rows     Most Recent Value  Intake Tab  Referral Department  Hospitalist  Unit at Time of Referral  Med/Surg Unit  Palliative Care Primary Diagnosis  Cancer  Date Notified  09/23/2017  Palliative Care Type  Return patient Palliative Care  Reason for referral  End of Life Care Assistance  Date of Admission  09/19/2017  Date first seen by Palliative Care  09/27/2017  # of days Palliative referral response time  0 Day(s)  # of days IP prior to Palliative referral  0  Clinical Assessment  Psychosocial & Spiritual Assessment  Palliative Care Outcomes      Patient Active Problem List   Diagnosis Date Noted  . Hematuria   . Metastatic cancer ([place])   . Goals of care, counseling/discussion   . Advance care planning   . Palliative care by specialist   . Cervical cancer, FIGO stage IVB ([place]) 09/18/2017  . Protein-calorie malnutrition, severe 09/08/2017  . Gross hematuria   . Urinary retention   . Sepsis ([place]) 09/05/2017    Palliative Care Assessment & Plan   Patient Profile:  67 y.o. female  with past medical history of stage IV cervical cancer with mets to liver and bone (had plans for palliative radiation/chemotherapy- but patient missed appointments- per Onc notes hospice was being recommended if patient came to next appointment), severe protein  malnourishment, COPD, recently discharged from hospital for admission due to septicemia related to pneumonia and COPD exacerbation presented to ED on 09/07/2017 with complaints of abdominal pain and being unable to void urine. Workup revealed bladder obstruction with severe hydrourerternephrosis with hemorrhage in the collecting system. Foley placed with gross hematuria output. Nephrostomy tubes recommended but patient declined. Patient requesting comfort measures only. Of note- patient has been deemed incompetent and has a legal guardian- Stacy Zhang- Social Worker with [place].  Palliative medicine consulted for goals of care and Hospice evaluation.   Assessment/Recommendations/Plan   Attempting to contact legal guardian- recommend full transition to focus on comfort- will start continuous morphine infusion if legal guardian agrees as patient appears to be becoming uremic  I do not think patient would be stable for transfer to residential hospice  Goals of Care and Additional Recommendations:  Limitations on Scope of Treatment: Full Comfort Care  Code Status:  DNR  Prognosis:   Hours - Days  Discharge Planning:  Anticipated Hospital Death  Care plan was discussed with patient's RN.  Thank you for allowing the Palliative Medicine Team to assist in the care of this patient.   Time In:  1015 Time Out: 1050 Total Time 35 mins Prolonged Time Billed no      Greater than 50%  of this time was spent counseling and coordinating care related to the above assessment and plan.  [name], AGNP-C Palliative Medicine   Please contact Palliative Medicine Team phone at [phone] for questions and concerns.

## 2017-09-23 NOTE — Progress Notes (Signed)
Nutrition Brief Note  Patient identified to be seen for low BMI. She is known to our service from recent admission where she met criteria for severe chronic malnutrition. Chart reviewed. Patient now transitioning to comfort care.   No further nutrition interventions warranted at this time.  Please consult RD as needed.   Willey Blade, MS, [place], LDN Office: [phone] Pager: [phone] After Hours/Weekend Pager: [phone]

## 2017-09-24 ENCOUNTER — Ambulatory Visit: Payer: Medicare Other

## 2017-09-24 DIAGNOSIS — N19 Unspecified kidney failure: Secondary | ICD-10-CM | POA: Diagnosis not present

## 2017-09-24 DIAGNOSIS — Z515 Encounter for palliative care: Secondary | ICD-10-CM | POA: Diagnosis not present

## 2017-09-24 LAB — URINE CULTURE
Culture: >=100000 — AB
Special Requests: NORMAL

## 2017-09-24 NOTE — Care Management Important Message (Signed)
Important Message  Patient Details  Name: Stacy Zhang MRN: [id number] Date of Birth: 1950/10/30   Medicare Important Message Given:  Yes    Shelbie Ammons, RN 09/24/2017, 7:19 AM

## 2017-09-24 NOTE — Plan of Care (Signed)
  Progressing Pain Managment: General experience of comfort will improve 09/24/2017 1256 - Progressing by Cherylann Parr, RN 09/24/2017 1256 - Not Progressing by Cherylann Parr, RN Safety: Ability to remain free from injury will improve 09/24/2017 1256 - Progressing by Cherylann Parr, RN 09/24/2017 1256 - Not Progressing by Cherylann Parr, RN Skin Integrity: Risk for impaired skin integrity will decrease 09/24/2017 1256 - Progressing by Cherylann Parr, RN 09/24/2017 1256 - Not Progressing by Cherylann Parr, RN Education: Knowledge of the prescribed therapeutic regimen will improve 09/24/2017 1258 - Progressing by Cherylann Parr, RN Clinical Measurements: Quality of life will improve 09/24/2017 1258 - Progressing by Cherylann Parr, RN Respiratory: Verbalizations of increased ease of respirations will increase 09/24/2017 1258 - Progressing by Cherylann Parr, RN Pain Management: Satisfaction with pain management regimen will improve 09/24/2017 1258 - Progressing by Cherylann Parr, RN

## 2017-09-24 NOTE — Plan of Care (Signed)
  Progressing Pain Managment: General experience of comfort will improve 09/24/2017 1256 - Progressing by Cherylann Parr, RN  Safety: Ability to remain free from injury will improve 09/24/2017 1256 - Progressing by Cherylann Parr, RN  Skin Integrity: Risk for impaired skin integrity will decrease 09/24/2017 1256 - Progressing by Cherylann Parr, RN

## 2017-09-24 NOTE — Progress Notes (Signed)
[place] at [place] NAME: Stacy Zhang    MR#:  [id number]  DATE OF BIRTH:  03/13/1951  SUBJECTIVE: Patient admitted for retention and found to have severe bilateral hydronephrosis, sepsis, acute renal failure.  Recently diagnosed with stage IV cervical cancer.  Comfort measures with morphine drip.  Patient unresponsive.  CHIEF COMPLAINT:   Chief Complaint  Patient presents with  . Urinary Retention    REVIEW OF SYSTEMS:   Review of Systems  Unable to perform ROS: Critical illness     DRUG ALLERGIES:  No Known Allergies  VITALS:  Blood pressure 110/64, pulse 98, temperature 98.6 F (37 C), temperature source Oral, resp. rate 18, height 5\' 2"  (1.575 m), weight 33.4 kg (73 lb 11.2 oz), SpO2 98 %.  PHYSICAL EXAMINATION:  GENERAL:  67 y.o.-year-old patient lying in the bed . cachectic, unresponsive EYES: Pupils pin point. HEENT: Head atraumatic, normocephalic. Oropharynx and nasopharynx clear.  NECK:  Supple, no jugular venous distention. No thyroid enlargement, no tenderness.  LUNGS: Decreased breath sound bilaterally.   CARDIOVASCULAR: S1, S2 normal. No murmurs, rubs, or gallops.  ABDOMEN: Soft, nontender, nondistended. Bowel sounds present. No organomegaly or mass.  EXTREMITIES: No pedal edema, cyanosis, or clubbing.  NEUROLOGIC: Unable to do exam because unresponsive under she is receiving morphine.   SKIN: No obvious rash, lesion, or ulcer.    LABORATORY PANEL:   CBC Recent Labs  Lab 09/23/17 0626  WBC 21.4*  HGB 8.3*  HCT 25.7*  PLT 378   ------------------------------------------------------------------------------------------------------------------  Chemistries  Recent Labs  Lab 10/01/2017 1000 09/23/17 0626  NA 144 144  K 5.8* 5.5*  CL 109 110  CO2 24 23  GLUCOSE 86 57*  BUN 64* 56*  CREATININE 2.03* 1.46*  CALCIUM 8.8* 8.6*  AST 225*  --   ALT 106*  --   ALKPHOS 137*  --   BILITOT 0.5  --     ------------------------------------------------------------------------------------------------------------------  Cardiac Enzymes No results for input(s): TROPONINI in the last 168 hours. ------------------------------------------------------------------------------------------------------------------  RADIOLOGY:  No results found.  EKG:   Orders placed or performed during the hospital encounter of 09/07/2017  . ED EKG  . ED EKG    ASSESSMENT AND PLAN:  67 year old female patient with recently diagnosed stage IV cervical cancer with liver metastases and bone metastases, severe protein malnutrition, COPD who recently discharged from hospital after admission for septicemia COPD came to ER yesterday with abdominal pain, urine retention and found to have severe bilateral hydronephrosis, patient Foley has gross hematuria.  Patient chose comfort measures continue morphine drip, Ativan, glycopyrrolate.     All the records are reviewed and case discussed with Care Management/Social Workerr. Management plans discussed with the patient, family and they are in agreement.  CODE STATUS: DNR  TOTAL TIME TAKING CARE OF THIS PATIENT: 35 minutes.  Unstable at this time   Epifanio Lesches M.D on 09/24/2017 at 1:02 PM  Between 7am to 6pm - Pager - [phone]  After 6pm go to [email or web address] - password [email or web address] [facility]  Office  [phone]  CC: Primary care physician; Letta Median, MD   Note: This dictation was prepared with Dragon dictation along with smaller phrase technology. Any transcriptional errors that result from this process are unintentional.

## 2017-09-24 NOTE — Progress Notes (Signed)
Daily Progress Note   Patient Name: Stacy Zhang       Date: 09/24/2017 DOB: 12/26/1950  Age: 67 y.o. MRN#: [id number] Attending Physician: Epifanio Lesches, MD Primary Care Physician: Letta Median, MD Admit Date: 09/27/2017  Reason for Consultation/Follow-up: Establishing goals of care and Terminal Care  Subjective: Patient in bed, respirations labored, she is diaphoretic, moans when I attempt to waken her, otherwise nonresponsive- actively dying. Her color is grey in appearance. I requested prn bolus from RN. Updated Andrika on patient's status.   Review of Systems  Unable to perform ROS: Patient unresponsive    Length of Stay: 2  Current Medications: Scheduled Meds:  . Chlorhexidine Gluconate Cloth  6 each Topical Q0600  . LORazepam  2 mg Intravenous Q4H    Continuous Infusions: . morphine 1 mg/hr (09/23/17 1759)    PRN Meds: acetaminophen **OR** acetaminophen, diphenhydrAMINE, glycopyrrolate **OR** glycopyrrolate **OR** glycopyrrolate, morphine injection, morphine, ondansetron **OR** ondansetron (ZOFRAN) IV  Physical Exam  Constitutional:  cachetic  Cardiovascular:  tachycardic  Neurological:  nonresponsive  Skin:  Color is grey  Nursing note and vitals reviewed.           Vital Signs: BP 110/64 (BP Location: Right Arm)   Pulse 98   Temp 98.6 F (37 C) (Oral)   Resp 18   Ht 5\' 2"  (1.575 m)   Wt 33.4 kg (73 lb 11.2 oz)   SpO2 98%   BMI 13.48 kg/m  SpO2: SpO2: 98 % O2 Device: O2 Device: Nasal Cannula O2 Flow Rate: O2 Flow Rate (L/min): 2 L/min  Intake/output summary:   Intake/Output Summary (Last 24 hours) at 09/24/2017 1052 Last data filed at 09/24/2017 0300 Gross per 24 hour  Intake 9.02 ml  Output 1000 ml  Net -990.98 ml   LBM: Last  BM Date: 09/20/17 Baseline Weight: Weight: 38.6 kg (85 lb) Most recent weight: Weight: 33.4 kg (73 lb 11.2 oz)       Palliative Assessment/Data: PPS: 10%    Flowsheet Rows     Most Recent Value  Intake Tab  Referral Department  Hospitalist  Unit at Time of Referral  Med/Surg Unit  Palliative Care Primary Diagnosis  Cancer  Date Notified  09/29/2017  Palliative Care Type  Return patient Palliative Care  Reason for referral  End of Life  Care Assistance  Date of Admission  09/08/2017  Date first seen by Palliative Care  09/14/2017  # of days Palliative referral response time  0 Day(s)  # of days IP prior to Palliative referral  0  Clinical Assessment  Psychosocial & Spiritual Assessment  Palliative Care Outcomes      Patient Active Problem List   Diagnosis Date Noted  . Terminal care   . Hematuria   . Metastatic cancer ([place])   . Goals of care, counseling/discussion   . Advance care planning   . Palliative care by specialist   . Cervical cancer, FIGO stage IVB ([place]) 09/18/2017  . Protein-calorie malnutrition, severe 09/08/2017  . Gross hematuria   . Urinary retention   . Sepsis ([place]) 09/05/2017    Palliative Care Assessment & Plan   Patient Profile: 67 y.o.femalewith past medical history of stage IV cervical cancer with mets to liver and bone (had plans for palliative radiation/chemotherapy- but patient missed appointments- per Onc notes hospice was being recommended if patient came to next appointment), severe protein malnourishment, COPD, recently discharged from hospital for admission due to septicemia related to pneumonia and COPD exacerbation presented to ED on 09/28/2017 with complaints of abdominal pain and being unable to void urine. Workup revealed bladder obstruction with severe hydrourerternephrosis with hemorrhage in the collecting system. Foley placed with gross hematuria output. Nephrostomy tubes recommended but patient declined. Patient requesting comfort measures  only. Of note- patient has been deemed incompetent and has a legal guardian- Jerilynn Mages- Social Worker with [place].  Palliative medicine consulted for goals of care and Hospice evaluation.  Assessment/Recommendations/Plan   Patient is actively dying  Not stable for transfer  Goals of Care and Additional Recommendations:  Limitations on Scope of Treatment: Full Comfort Care  Code Status:  DNR  Prognosis:   Hours - Days  Discharge Planning:  Anticipated Hospital Death  Care plan was discussed with patient's guardian- Ladona Horns.  Thank you for allowing the Palliative Medicine Team to assist in the care of this patient.   Time In: 0830 Time Out: 0855 Total Time 25 mins Prolonged Time Billed no      Greater than 50%  of this time was spent counseling and coordinating care related to the above assessment and plan.  [name], AGNP-C Palliative Medicine   Please contact Palliative Medicine Team phone at [phone] for questions and concerns.

## 2017-09-25 ENCOUNTER — Ambulatory Visit: Payer: Medicare Other

## 2017-09-25 DIAGNOSIS — Z515 Encounter for palliative care: Secondary | ICD-10-CM | POA: Diagnosis not present

## 2017-09-25 DIAGNOSIS — C799 Secondary malignant neoplasm of unspecified site: Secondary | ICD-10-CM | POA: Diagnosis not present

## 2017-09-25 DIAGNOSIS — A419 Sepsis, unspecified organism: Secondary | ICD-10-CM | POA: Diagnosis not present

## 2017-09-27 LAB — CULTURE, BLOOD (ROUTINE X 2)
CULTURE: NO GROWTH
Culture: NO GROWTH
SPECIAL REQUESTS: ADEQUATE
Special Requests: ADEQUATE

## 2017-09-28 ENCOUNTER — Ambulatory Visit: Payer: Medicare Other

## 2017-09-30 ENCOUNTER — Other Ambulatory Visit: Payer: Medicare Other

## 2017-10-02 NOTE — Progress Notes (Signed)
   10/13/17 1245  Clinical Encounter Type  Visited With Health care provider  Visit Type Death  Referral From Nurse  Consult/Referral To Chaplain  Spiritual Encounters  Spiritual Needs Prayer;Grief support   [place] notified that PT had expired. [place] learned that PT was alone with no family present. [place] provided support to staff and stayed with PT until removed to morgue.

## 2017-10-02 NOTE — Discharge Summary (Signed)
    Death Note please see Last Note for all details.   In breif -67 year old female patient with stage IV cervical cancer with metastases to liver and bone with recent diagnosis of all these problems comes in with unable to urinate, urine retention,'s severe bilateral hydronephrosis.  Patient chose comfort measures, seen by palliative care team, started on morphine drip, patient expired peacefully today.  Patient had no family members visiting.    Stacy Zhang YEM:[id number],PNP:[id number] is a 67 y.o. female, Outpatient Primary MD for the patient is Letta Median, MD  Pronounced dead by RN on 2017-09-27     @12 :80 PM                  Cause of death  Stage IV cervical cancer with metastases to liver, bone  Total clinical and documentation time for today Under 30 minutes   Last Note

## 2017-10-02 NOTE — Progress Notes (Signed)
Daily Progress Note   Patient Name: Stacy Zhang       Date: 10-14-2017 DOB: [birth year]  Age: 67 y.o. MRN#: [id number] Attending Physician: Epifanio Lesches, MD Primary Care Physician: Letta Median, MD Admit Date: 09/23/2017  Reason for Consultation/Follow-up: Establishing goals of care and Terminal Care  Subjective: Patient in bed, respirations shallow, unlabored, colr is gray, she is unresponsive. Actively dying.  Review of Systems  Unable to perform ROS: Patient unresponsive    Length of Stay: 3  Current Medications: Scheduled Meds:  . Chlorhexidine Gluconate Cloth  6 each Topical Q0600  . LORazepam  2 mg Intravenous Q4H    Continuous Infusions: . morphine 1 mg/hr (09/23/17 1759)    PRN Meds: acetaminophen **OR** acetaminophen, diphenhydrAMINE, glycopyrrolate **OR** glycopyrrolate **OR** glycopyrrolate, morphine, ondansetron **OR** ondansetron (ZOFRAN) IV  Physical Exam  Constitutional:  cachetic  Cardiovascular:  tachycardic  Neurological:  nonresponsive  Skin:  Color is grey  Nursing note and vitals reviewed.           Vital Signs: BP (!) 104/49 (BP Location: Left Arm)   Pulse (!) 125   Temp 98.9 F (37.2 C) (Oral)   Resp 12   Ht 5\' 2"  (1.575 m)   Wt 33.4 kg (73 lb 11.2 oz)   SpO2 (!) 74%   BMI 13.48 kg/m  SpO2: SpO2: (!) 74 % O2 Device: O2 Device: Nasal Cannula O2 Flow Rate: O2 Flow Rate (L/min): 2 L/min  Intake/output summary:   Intake/Output Summary (Last 24 hours) at 14-Oct-2017 1052 Last data filed at 10-14-2017 0500 Gross per 24 hour  Intake 6 ml  Output 1200 ml  Net -1194 ml   LBM: Last BM Date: (unknown) Baseline Weight: Weight: 38.6 kg (85 lb) Most recent weight: Weight: 33.4 kg (73 lb 11.2 oz)       Palliative  Assessment/Data: PPS: 10%    Flowsheet Rows     Most Recent Value  Intake Tab  Referral Department  Hospitalist  Unit at Time of Referral  Med/Surg Unit  Palliative Care Primary Diagnosis  Cancer  Date Notified  09/14/2017  Palliative Care Type  Return patient Palliative Care  Reason for referral  End of Life Care Assistance  Date of Admission  09/26/2017  Date first seen by Palliative Care  09/07/2017  # of days  Palliative referral response time  0 Day(s)  # of days IP prior to Palliative referral  0  Clinical Assessment  Psychosocial & Spiritual Assessment  Palliative Care Outcomes      Patient Active Problem List   Diagnosis Date Noted  . Renal failure   . Terminal care   . Hematuria   . Metastatic cancer ([place])   . Goals of care, counseling/discussion   . Advance care planning   . Palliative care by specialist   . Cervical cancer, FIGO stage IVB ([place]) 09/18/2017  . Protein-calorie malnutrition, severe 09/08/2017  . Gross hematuria   . Urinary retention   . Sepsis ([place]) 09/05/2017    Palliative Care Assessment & Plan   Patient Profile: 67 y.o.femalewith past medical history of stage IV cervical cancer with mets to liver and bone (had plans for palliative radiation/chemotherapy- but patient missed appointments- per Onc notes hospice was being recommended if patient came to next appointment), severe protein malnourishment, COPD, recently discharged from hospital for admission due to septicemia related to pneumonia and COPD exacerbation presented to ED on 09/09/2017 with complaints of abdominal pain and being unable to void urine. Workup revealed bladder obstruction with severe hydrourerternephrosis with hemorrhage in the collecting system. Foley placed with gross hematuria output. Nephrostomy tubes recommended but patient declined. Patient requesting comfort measures only. Of note- patient has been deemed incompetent and has a legal guardian- Jerilynn Mages- Social Worker with  [place].  Palliative medicine consulted for goals of care and Hospice evaluation.  Assessment/Recommendations/Plan   Patient is actively dying  Not stable for transfer  Goals of Care and Additional Recommendations:  Limitations on Scope of Treatment: Full Comfort Care  Code Status:  DNR  Prognosis:   Hours - Days  Discharge Planning:  Anticipated Hospital Death  Care plan was discussed with patient's guardian- Ladona Horns.  Thank you for allowing the Palliative Medicine Team to assist in the care of this patient.   Time In: 1030 Time Out: 1045 Total Time 15 mins Prolonged Time Billed no      Greater than 50%  of this time was spent counseling and coordinating care related to the above assessment and plan.  [name], AGNP-C Palliative Medicine   Please contact Palliative Medicine Team phone at [phone] for questions and concerns.

## 2017-10-02 NOTE — Progress Notes (Signed)
Called to room by NT. Upon assessment, pt not breathing and has no pulse. This RN and Rowe Robert, RN pronounced patient dead at 12:31 pm. [facility] and MD notified, pt's legal guardian notified. Ammie Dalton, RN

## 2017-10-02 DEATH — deceased

## 2017-10-07 ENCOUNTER — Other Ambulatory Visit: Payer: Medicare Other

## 2017-10-14 ENCOUNTER — Other Ambulatory Visit: Payer: Medicare Other

## 2019-08-14 IMAGING — CR DG CHEST 2V
2 series · 2 of 2 positions shown · non-contrast
Comparison: 05/02/2012

CLINICAL DATA: Difficulty breathing since this morning. Left rib
cage pain. History of chronic COPD.

EXAM:
CHEST  2 VIEW

[chest lat]
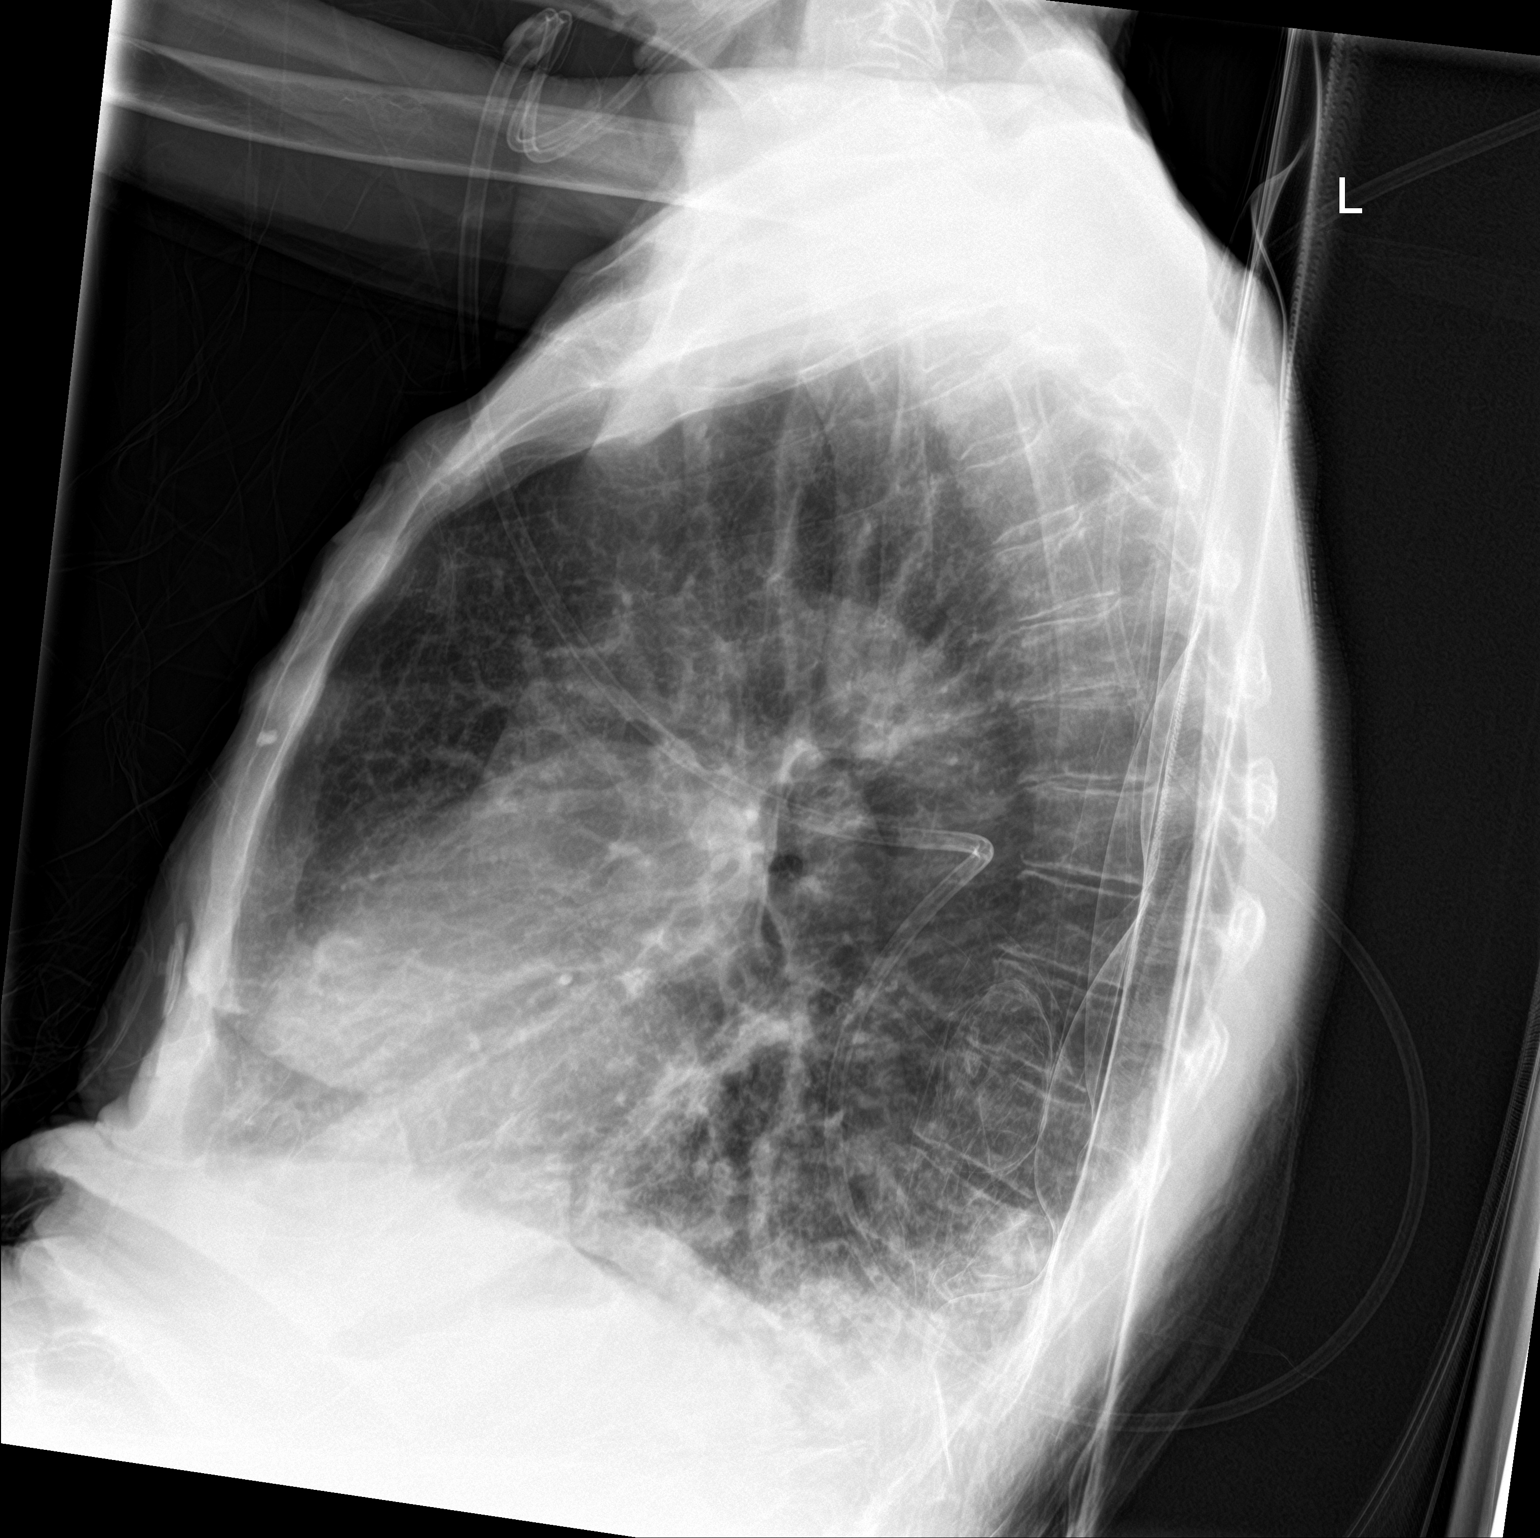

[chest ap]
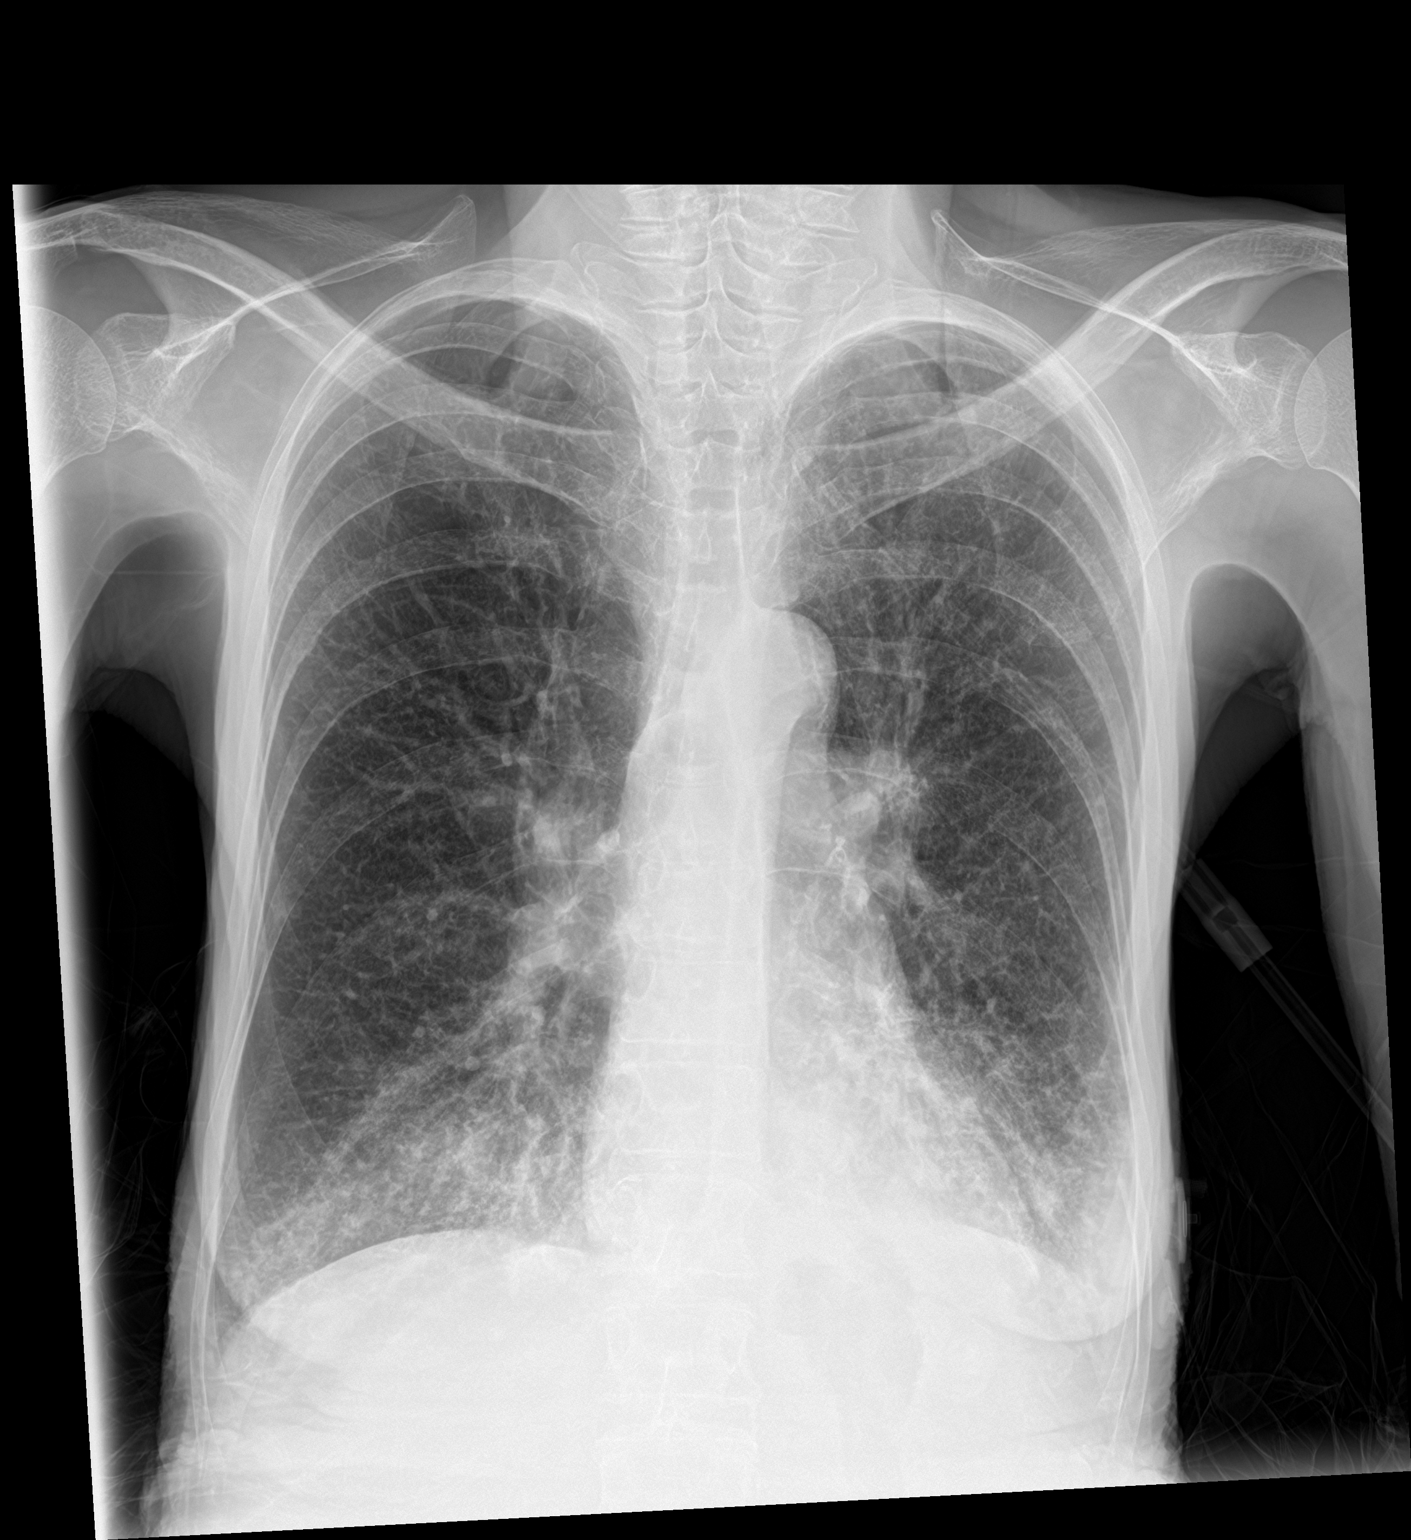

[2 of 2 positions shown; findings below may reference images not displayed]

FINDINGS: Normal heart size and pulmonary vascularity. Emphysematous changes
in the lungs with peribronchial thickening and interstitial changes
consistent with chronic bronchitis. Since the previous study, there
is interval development of infiltration in both lung bases with
small bilateral pleural effusions. This could indicate developing
multifocal pneumonia or edema. No pneumothorax. Mediastinal contours
appear intact. Calcification of the aorta.
IMPRESSION: New bilateral basilar infiltrates and effusions superimposed upon
chronic emphysematous changes and chronic bronchitic changes.
Appearance could indicate developing multifocal pneumonia or edema.
